# Patient Record
Sex: Male | Born: 1945
Health system: Southern US, Community
[De-identification: ages and names within clinical notes are randomized; demographics above are authoritative.]

## PROBLEM LIST (undated history)

## (undated) DIAGNOSIS — E785 Hyperlipidemia, unspecified: Secondary | ICD-10-CM

## (undated) DIAGNOSIS — I2510 Atherosclerotic heart disease of native coronary artery without angina pectoris: Secondary | ICD-10-CM

## (undated) DIAGNOSIS — N400 Enlarged prostate without lower urinary tract symptoms: Secondary | ICD-10-CM

## (undated) DIAGNOSIS — G540 Brachial plexus disorders: Secondary | ICD-10-CM

## (undated) DIAGNOSIS — M1990 Unspecified osteoarthritis, unspecified site: Secondary | ICD-10-CM

## (undated) DIAGNOSIS — Z86010 Personal history of colonic polyps: Secondary | ICD-10-CM

## (undated) DIAGNOSIS — I10 Essential (primary) hypertension: Secondary | ICD-10-CM

## (undated) HISTORY — DX: Essential (primary) hypertension: I10

## (undated) HISTORY — PX: COLONOSCOPY W/ POLYPECTOMY: SHX1380

## (undated) HISTORY — DX: Atherosclerotic heart disease of native coronary artery without angina pectoris: I25.10

## (undated) HISTORY — PX: NASAL SINUS SURGERY: SHX719

## (undated) HISTORY — DX: Hyperlipidemia, unspecified: E78.5

## (undated) HISTORY — PX: NM RENAL LASIX (ARMC HX): HXRAD1213

## (undated) HISTORY — DX: Benign prostatic hyperplasia without lower urinary tract symptoms: N40.0

## (undated) HISTORY — DX: Personal history of colonic polyps: Z86.010

## (undated) HISTORY — PX: HAND TENDON SURGERY: SHX663

---

## 2000-07-28 ENCOUNTER — Emergency Department (HOSPITAL_COMMUNITY): Admission: EM | Admit: 2000-07-28 | Discharge: 2000-07-28 | Payer: Self-pay | Admitting: Emergency Medicine

## 2002-02-17 ENCOUNTER — Ambulatory Visit (HOSPITAL_COMMUNITY): Admission: RE | Admit: 2002-02-17 | Discharge: 2002-02-17 | Payer: Self-pay | Admitting: *Deleted

## 2002-02-17 ENCOUNTER — Encounter (INDEPENDENT_AMBULATORY_CARE_PROVIDER_SITE_OTHER): Payer: Self-pay

## 2003-05-29 ENCOUNTER — Encounter: Admission: RE | Admit: 2003-05-29 | Discharge: 2003-05-29 | Payer: Self-pay | Admitting: Family Medicine

## 2003-07-13 ENCOUNTER — Encounter: Admission: RE | Admit: 2003-07-13 | Discharge: 2003-07-13 | Payer: Self-pay | Admitting: Otolaryngology

## 2009-04-19 ENCOUNTER — Encounter: Payer: Self-pay | Admitting: Internal Medicine

## 2009-05-07 ENCOUNTER — Ambulatory Visit: Payer: Self-pay | Admitting: Internal Medicine

## 2009-05-07 DIAGNOSIS — Z860100 Personal history of colon polyps, unspecified: Secondary | ICD-10-CM | POA: Insufficient documentation

## 2009-05-07 DIAGNOSIS — I10 Essential (primary) hypertension: Secondary | ICD-10-CM

## 2009-05-07 DIAGNOSIS — N400 Enlarged prostate without lower urinary tract symptoms: Secondary | ICD-10-CM | POA: Insufficient documentation

## 2009-05-07 DIAGNOSIS — E785 Hyperlipidemia, unspecified: Secondary | ICD-10-CM | POA: Insufficient documentation

## 2009-05-07 HISTORY — DX: Essential (primary) hypertension: I10

## 2009-05-07 HISTORY — DX: Benign prostatic hyperplasia without lower urinary tract symptoms: N40.0

## 2009-05-07 HISTORY — DX: Personal history of colonic polyps: Z86.010

## 2009-05-07 HISTORY — DX: Hyperlipidemia, unspecified: E78.5

## 2009-05-07 HISTORY — DX: Personal history of colon polyps, unspecified: Z86.0100

## 2009-09-25 ENCOUNTER — Encounter: Payer: Self-pay | Admitting: Internal Medicine

## 2010-01-01 ENCOUNTER — Ambulatory Visit: Payer: Self-pay | Admitting: Internal Medicine

## 2010-01-01 LAB — CONVERTED CEMR LAB
Alkaline Phosphatase: 54 units/L (ref 39–117)
BUN: 20 mg/dL (ref 6–23)
Basophils Absolute: 0.1 10*3/uL (ref 0.0–0.1)
Basophils Relative: 1.1 % (ref 0.0–3.0)
Bilirubin Urine: NEGATIVE
Bilirubin, Direct: 0.1 mg/dL (ref 0.0–0.3)
CO2: 30 meq/L (ref 19–32)
Calcium: 9.8 mg/dL (ref 8.4–10.5)
Cholesterol: 215 mg/dL — ABNORMAL HIGH (ref 0–200)
Creatinine, Ser: 1.1 mg/dL (ref 0.4–1.5)
Direct LDL: 121.9 mg/dL
Eosinophils Absolute: 0.3 10*3/uL (ref 0.0–0.7)
Glucose, Urine, Semiquant: NEGATIVE
Ketones, urine, test strip: NEGATIVE
Lymphocytes Relative: 23.2 % (ref 12.0–46.0)
MCHC: 34.7 g/dL (ref 30.0–36.0)
Monocytes Absolute: 0.6 10*3/uL (ref 0.1–1.0)
Neutrophils Relative %: 62.5 % (ref 43.0–77.0)
PSA: 5.88 ng/mL — ABNORMAL HIGH (ref 0.10–4.00)
Platelets: 257 10*3/uL (ref 150.0–400.0)
Protein, U semiquant: NEGATIVE
RBC: 4.98 M/uL (ref 4.22–5.81)
RDW: 13.5 % (ref 11.5–14.6)
Total Bilirubin: 0.8 mg/dL (ref 0.3–1.2)
Total CHOL/HDL Ratio: 3
Triglycerides: 71 mg/dL (ref 0.0–149.0)
Urobilinogen, UA: 0.2
VLDL: 14.2 mg/dL (ref 0.0–40.0)
pH: 7

## 2010-01-08 ENCOUNTER — Encounter: Payer: Self-pay | Admitting: Internal Medicine

## 2010-01-08 ENCOUNTER — Ambulatory Visit: Payer: Self-pay | Admitting: Internal Medicine

## 2010-03-27 ENCOUNTER — Encounter: Payer: Self-pay | Admitting: Internal Medicine

## 2010-04-09 NOTE — Letter (Signed)
Summary: [facility]  [facility]   Imported By: Maryln Gottron 10/01/2009 10:28:27  _____________________________________________________________________  External Attachment:    Type:   Image     Comment:   External Document

## 2010-04-09 NOTE — Assessment & Plan Note (Signed)
Summary: TO BE EST/NJR   Vital Signs:  Patient profile:   65 year old male Height:      65.5 inches Weight:      183 pounds BMI:     30.10 Temp:     98.4 degrees F oral BP sitting:   130 / 80  (left arm) Cuff size:   regular  Vitals Entered By: Duard Brady LPN (May 07, 2009 12:58 PM) CC: new to est - no immedicate concerns - may need rx's Is Patient Diabetic? No   CC:  new to est - no immedicate concerns - may need rx's.  History of Present Illness:   65 year old patient seen today to set this with our practice.  he has an approximate year history of treated hypertension.  He has mild dyslipidemia, and is followed by urology for BPH. and  elevated PSA.  He is status post prostate biopsy in September of last year.  Has a history of colonic polyps.  Preventive Screening-Counseling & Management  Alcohol-Tobacco     Smoking Status: never  Caffeine-Diet-Exercise     Does Patient Exercise: yes  Allergies (verified): No Known Drug Allergies  Past History:  Past Medical History: Colonic polyps, hx of Hyperlipidemia Hypertension Benign prostatic hypertrophy elevated PSA  Past Surgical History: prostate biopsy, September 2010 severed FPL tendon (L thumb) 2002 Sinus surgery 2004  Colonoscopy.  2008  Family History: Reviewed history and no changes required. father died age 88, history of hypertension, dyslipidemia, cerebral aneurysm mother died age 13 from a ruptured abdominal aortic aneurysm history of dyslipidemia and hypertension  Three sisters are well maternal grandmother pancreatic cancer  Social History: Reviewed history and no changes required. Occupation:  Psychologist, sport and exercise Married Never Smoked Regular exercise-yes Smoking Status:  never Does Patient Exercise:  yes  Review of Systems  The patient denies anorexia, fever, weight loss, weight gain, vision loss, decreased hearing, hoarseness, chest pain, syncope, dyspnea on exertion, peripheral  edema, prolonged cough, headaches, hemoptysis, abdominal pain, melena, hematochezia, severe indigestion/heartburn, hematuria, incontinence, genital sores, muscle weakness, suspicious skin lesions, transient blindness, difficulty walking, depression, unusual weight change, abnormal bleeding, enlarged lymph nodes, angioedema, breast masses, and testicular masses.    Physical Exam  General:  overweight-appearing.  120/80 Head:  Normocephalic and atraumatic without obvious abnormalities. No apparent alopecia or balding. Eyes:  No corneal or conjunctival inflammation noted. EOMI. Perrla. Funduscopic exam benign, without hemorrhages, exudates or papilledema. Vision grossly normal. Ears:  External ear exam shows no significant lesions or deformities.  Otoscopic examination reveals clear canals, tympanic membranes are intact bilaterally without bulging, retraction, inflammation or discharge. Hearing is grossly normal bilaterally. Mouth:  Oral mucosa and oropharynx without lesions or exudates.  Teeth in good repair. Neck:  No deformities, masses, or tenderness noted. Chest Wall:  No deformities, masses, tenderness or gynecomastia noted. Breasts:  No masses or gynecomastia noted Lungs:  Normal respiratory effort, chest expands symmetrically. Lungs are clear to auscultation, no crackles or wheezes. Heart:  Normal rate and regular rhythm. S1 and S2 normal without gallop, murmur, click, rub or other extra sounds. Abdomen:  Bowel sounds positive,abdomen soft and non-tender without masses, organomegaly or hernias noted. Genitalia:  Testes bilaterally descended without nodularity, tenderness or masses. No scrotal masses or lesions. No penis lesions or urethral discharge. Msk:  No deformity or scoliosis noted of thoracic or lumbar spine.   Pulses:  R and L carotid,radial,femoral,dorsalis pedis and posterior tibial pulses are full and equal bilaterally Extremities:  No clubbing, cyanosis, edema,  or deformity noted  with normal full range of motion of all joints.   Neurologic:  No cranial nerve deficits noted. Station and gait are normal. Plantar reflexes are down-going bilaterally. DTRs are symmetrical throughout. Sensory, motor and coordinative functions appear intact.   Impression & Recommendations:  Problem # 1:  Preventive Health Care (ICD-V70.0)  Complete Medication List: 1)  Hydrochlorothiazide 25 Mg Tabs (Hydrochlorothiazide) .... Qd 2)  Tamsulosin Hcl 0.4 Mg Caps (Tamsulosin hcl) .... Once daily 3)  Aspir-low 81 Mg Tbec (Aspirin) .... Qd 4)  Mens Multivitamin Plus Tabs (Multiple vitamins-minerals) .... Qd 5)  Calcium 600 1500 Mg Tabs (Calcium carbonate) .... 2 qd 6)  Vitamin D 1000 Unit Tabs (Cholecalciferol) .... 2qd 7)  Co Q-10 150 Mg Caps (Coenzyme q10) .... Qd 8)  Cinnamon 500 Mg Tabs (Cinnamon) .... 2qd  Patient Instructions: 1)  Stick with a low residue diet and avoid foods that can irritate your bowels. 2)  It is important that you exercise regularly at least 20 minutes 5 times a week. If you develop chest pain, have severe difficulty breathing, or feel very tired , stop exercising immediately and seek medical attention. 3)  You need to lose weight. Consider a lower calorie diet and regular exercise.  4)  Check your Blood Pressure regularly. If it is above: 150/90  you should make an appointment. Prescriptions: TAMSULOSIN HCL 0.4 MG CAPS (TAMSULOSIN HCL) once daily  #90 x 6   Entered and Authorized by:   Gordy Savers  MD   Signed by:   Gordy Savers  MD on 05/07/2009   Method used:   Print then Give to Patient   RxID:   [id number] HYDROCHLOROTHIAZIDE 25 MG TABS (HYDROCHLOROTHIAZIDE) qd  #90 x 6   Entered and Authorized by:   Gordy Savers  MD   Signed by:   Gordy Savers  MD on 05/07/2009   Method used:   Print then Give to Patient   RxID:   [id number]

## 2010-04-09 NOTE — Miscellaneous (Signed)
Summary: Waiver of Liability for Shingles Vaccine  Waiver of Liability for Shingles Vaccine   Imported By: Maryln Gottron 01/10/2010 09:17:02  _____________________________________________________________________  External Attachment:    Type:   Image     Comment:   External Document

## 2010-04-09 NOTE — Assessment & Plan Note (Signed)
Summary: CPX // RS   Vital Signs:  Patient profile:   65 year old male Height:      65.5 inches Weight:      189 pounds Temp:     97.9 degrees F oral BP sitting:   140 / 90  (left arm) Cuff size:   regular  Vitals Entered By: Kern Reap CMA Duncan Dull) (January 08, 2010 8:52 AM) CC: wellness exam Is Patient Diabetic? No Pain Assessment Patient in pain? no        CC:  wellness exam.  History of Present Illness: 65 year old patient who is seen today for a wellness exam.  He is followed closely by neurology due to an elevated PSA.  He has a history of hypertension, which has been stable.  For the past 6 to 9 months, he has had some slightly loose stool, but really no change in his health.  Allergies: No Known Drug Allergies  Past History:  Past Medical History: Reviewed history from 05/07/2009 and no changes required. Colonic polyps, hx of Hyperlipidemia Hypertension Benign prostatic hypertrophy elevated PSA  Past Surgical History: prostate biopsy, September 2010, march 2011 severed FPL tendon (L thumb) 2002 Sinus surgery 2004  Colonoscopy.  2008  Family History: Reviewed history from 05/07/2009 and no changes required. father died age 58, history of hypertension, dyslipidemia, cerebral aneurysm mother died age 53  from a ruptured abdominal aortic aneurysm history of dyslipidemia and hypertension  Three sisters are well maternal grandmother pancreatic cancer  Social History: Reviewed history from 05/07/2009 and no changes required. 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    Physical Exam  General:  overweight-appearing.  120/80overweight-appearing.   Head:  Normocephalic and atraumatic without obvious abnormalities. No apparent alopecia or balding. Eyes:  No corneal or conjunctival inflammation noted. EOMI. Perrla. Funduscopic exam benign, without hemorrhages, exudates or papilledema. Vision grossly normal. Ears:  External ear exam shows no significant lesions or deformities.  Otoscopic examination reveals clear canals, tympanic membranes are intact bilaterally without bulging, retraction, inflammation or discharge. Hearing is grossly normal bilaterally. Nose:  External nasal examination shows no deformity or inflammation. Nasal mucosa are pink and moist without lesions or exudates. Mouth:  Oral mucosa and oropharynx without lesions or exudates.  Teeth in good repair. Neck:  No deformities, masses, or tenderness noted. Chest Wall:  No deformities, masses, tenderness or gynecomastia noted. Breasts:  No masses or gynecomastia noted Lungs:  Normal respiratory effort, chest expands symmetrically. Lungs are clear to auscultation, no crackles or wheezes. Heart:  Normal rate and regular rhythm. S1 and S2 normal without gallop, murmur, click, rub or other extra sounds. Abdomen:  Bowel sounds positive,abdomen soft and non-tender without masses, organomegaly or hernias noted. Genitalia:  Testes bilaterally descended without nodularity, tenderness or masses. No scrotal masses or lesions. No penis lesions or urethral discharge. Msk:  No deformity or scoliosis noted of thoracic or lumbar spine.   Pulses:  slight decreased pedal pulses on the right Extremities:  No clubbing, cyanosis, edema, or deformity noted with normal full range of motion of all joints.   Neurologic:  No  cranial nerve deficits  noted. Station and gait are normal. Plantar reflexes are down-going bilaterally. DTRs are symmetrical throughout. Sensory, motor and coordinative functions appear intact. Skin:  Intact without suspicious lesions or rashes Cervical Nodes:  No lymphadenopathy noted Axillary Nodes:  No palpable lymphadenopathy Inguinal Nodes:  No significant adenopathy Psych:  Cognition and judgment appear intact. Alert and cooperative with normal attention span and concentration. No apparent delusions, illusions, hallucinations   Impression & Recommendations:  Problem # 1:  PREVENTIVE HEALTH CARE (ICD-V70.0)  Complete Medication List: 1)  Hydrochlorothiazide 25 Mg Tabs (Hydrochlorothiazide) .... Qd 2)  Tamsulosin Hcl 0.4 Mg Caps (Tamsulosin hcl) .... Once daily 3)  Aspir-low 81 Mg Tbec (Aspirin) .... Qd 4)  Mens Multivitamin Plus Tabs (Multiple vitamins-minerals) .... Qd 5)  Calcium 600 1500 Mg Tabs (Calcium carbonate) .... 2 qd 6)  Vitamin D 1000 Unit Tabs (Cholecalciferol) .... 2qd 7)  Co Q-10 150 Mg Caps (Coenzyme q10) .... Qd 8)  Cinnamon 500 Mg Tabs (Cinnamon) .... 2qd  Patient Instructions: 1)  Please schedule a follow-up appointment in 1 year. 2)  Limit your Sodium (Salt). 3)  It is important that you exercise regularly at least 20 minutes 5 times a week. If you develop chest pain, have severe difficulty breathing, or feel very tired , stop exercising immediately and seek medical attention. 4)  You need to lose weight. Consider a lower calorie diet and regular exercise.  5)  Check your Blood Pressure regularly. If it is above:  150/90 you should make an appointment. Prescriptions: TAMSULOSIN HCL 0.4 MG CAPS (TAMSULOSIN HCL) once daily  #90 x 6   Entered and Authorized by:   Gordy Savers  MD   Signed by:   Gordy Savers  MD on 01/08/2010   Method used:   Print then Give to Patient   RxID:   [id number] HYDROCHLOROTHIAZIDE 25 MG TABS (HYDROCHLOROTHIAZIDE) qd  #90 x 6   Entered  and Authorized by:   Gordy Savers  MD   Signed by:   Gordy Savers  MD on 01/08/2010   Method used:   Print then Give to Patient   RxID:   [id number]    Orders Added: 1)  Est. Patient 40-64 years [99396]     Appended Document: CPX // RS     Allergies: No Known Drug Allergies  Review of Systems       Flu Vaccine Consent Questions     Do you have a history of severe allergic reactions to this vaccine? no    Any prior history of allergic reactions to egg and/or gelatin? no    Do you have a sensitivity to the preservative Thimersol? no    Do you have a past history of Guillan-Barre Syndrome? no    Do you currently have an acute febrile illness? no    Have you ever had a severe reaction to latex? no    Vaccine information given and explained to patient? yes    Are you currently pregnant? no    Lot Number:[id number]   Exp Date:09/07/2010   Site Given  Right  Deltoid IM    Complete Medication List: 1)  Hydrochlorothiazide 25 Mg Tabs (Hydrochlorothiazide) .... Qd 2)  Tamsulosin Hcl 0.4 Mg Caps (Tamsulosin hcl) .... Once daily 3)  Aspir-low 81 Mg Tbec (Aspirin) .... Qd 4)  Mens Multivitamin Plus Tabs (Multiple vitamins-minerals) .... Qd 5)  Calcium 600 1500 Mg Tabs (Calcium carbonate) .... 2 qd 6)  Vitamin D  1000 Unit Tabs (Cholecalciferol) .... 2qd 7)  Co Q-10 150 Mg Caps (Coenzyme q10) .... Qd 8)  Cinnamon 500 Mg Tabs (Cinnamon) .... 2qd  Other Orders: EKG w/ Interpretation (93000) Admin 1st Vaccine (25053) Flu Vaccine 25yrs + (97673) Tdap => 44yrs IM (41937) Admin of Any Addtl Vaccine (90240) Zoster (Shingles) Vaccine Live (97353)   Orders Added: 1)  EKG w/ Interpretation [93000] 2)  Admin 1st Vaccine [90471] 3)  Flu Vaccine 65yrs + [29924] 4)  Tdap => 62yrs IM [90715] 5)  Admin of Any Addtl Vaccine [90472] 6)  Zoster (Shingles) Vaccine Live [26834]   Immunizations Administered:  Tetanus Vaccine:    Vaccine Type: Tdap    Site: right  deltoid    Mfr: GlaxoSmithKline    Dose: 0.5 ml    Route: IM    Given by: Kern Reap CMA (AAMA)    Exp. Date: 12/28/2011    Lot #: [id number]  Zostavax # 1:    Vaccine Type: Zostavax    Site: right deltoid    Mfr: Merck    Dose: 0.65    Route: [place]    Given by: Kern Reap CMA (AAMA)    Exp. Date: 10/26/2010    Lot #: [id number]    Physician counseled: yes   Immunizations Administered:  Tetanus Vaccine:    Vaccine Type: Tdap    Site: right deltoid    Mfr: GlaxoSmithKline    Dose: 0.5 ml    Route: IM    Given by: Kern Reap CMA (AAMA)    Exp. Date: 12/28/2011    Lot #: [id number]  Zostavax # 1:    Vaccine Type: Zostavax    Site: right deltoid    Mfr: Merck    Dose: 0.65    Route: [place]    Given by: Kern Reap CMA (AAMA)    Exp. Date: 10/26/2010    Lot #: [id number]    Physician counseled: yes

## 2010-04-09 NOTE — Letter (Signed)
Summary: Records from [facility] 2008 - 2011  Records from [facility] 2008 - 2011   Imported By: Maryln Gottron 05/02/2009 09:35:19  _____________________________________________________________________  External Attachment:    Type:   Image     Comment:   External Document

## 2010-04-11 NOTE — Letter (Signed)
Summary: [facility]  [facility]   Imported By: Maryln Gottron 04/04/2010 11:19:41  _____________________________________________________________________  External Attachment:    Type:   Image     Comment:   External Document

## 2010-06-17 ENCOUNTER — Other Ambulatory Visit: Payer: Self-pay | Admitting: Internal Medicine

## 2010-12-24 ENCOUNTER — Telehealth: Payer: Self-pay | Admitting: Internal Medicine

## 2010-12-24 MED ORDER — ZOLPIDEM TARTRATE 5 MG PO TABS: 5.0000 mg | ORAL_TABLET | Freq: Every evening | ORAL | Status: DC | PRN

## 2010-12-24 NOTE — Telephone Encounter (Signed)
Please advise 

## 2010-12-24 NOTE — Telephone Encounter (Signed)
Generic Ambien  5 mg #30 

## 2010-12-24 NOTE — Telephone Encounter (Signed)
Pt is having a lot of stress and is having a hard time sleeping. Pt is wanting to get a sleep aid, please contact pt.

## 2011-01-01 ENCOUNTER — Ambulatory Visit (INDEPENDENT_AMBULATORY_CARE_PROVIDER_SITE_OTHER): Payer: 59 | Admitting: Internal Medicine

## 2011-01-01 ENCOUNTER — Encounter: Payer: Self-pay | Admitting: Internal Medicine

## 2011-01-01 DIAGNOSIS — F411 Generalized anxiety disorder: Secondary | ICD-10-CM

## 2011-01-01 DIAGNOSIS — F419 Anxiety disorder, unspecified: Secondary | ICD-10-CM

## 2011-01-01 DIAGNOSIS — I10 Essential (primary) hypertension: Secondary | ICD-10-CM

## 2011-01-01 DIAGNOSIS — R079 Chest pain, unspecified: Secondary | ICD-10-CM

## 2011-01-01 DIAGNOSIS — Z0000 Encounter for general adult medical examination without abnormal findings: Secondary | ICD-10-CM

## 2011-01-01 DIAGNOSIS — Z23 Encounter for immunization: Secondary | ICD-10-CM

## 2011-01-01 MED ORDER — SERTRALINE HCL 50 MG PO TABS: 50.0000 mg | ORAL_TABLET | Freq: Every day | ORAL | Status: DC

## 2011-01-01 MED ORDER — LORAZEPAM 0.5 MG PO TABS: 0.5000 mg | ORAL_TABLET | Freq: Two times a day (BID) | ORAL | Status: DC

## 2011-01-01 NOTE — Patient Instructions (Signed)
It is important that you exercise regularly, at least 20 minutes 3 to 4 times per week.  If you develop chest pain or shortness of breath seek  medical attention.   

## 2011-01-01 NOTE — Progress Notes (Signed)
  Subjective:    Patient ID: Joel [name], male    DOB: 01-09-1946, 65 y.o.   MRN: [id number]  HPI  65 year old patient who is seen today for followup. He has had considerable situational stress due to the poor economy and his struggling business.  He is sleeping poorly and Ambien has been called in which has been quite helpful he describes significant anxiety stress and was somewhat concerned about his heart. He basically has some occasional twinges of mainly left arm discomfort but occasionally in the chest. He does have hypertension but no history of exertional chest pain.    Review of Systems  Constitutional: Negative for fever, chills, appetite change and fatigue.  HENT: Negative for hearing loss, ear pain, congestion, sore throat, trouble swallowing, neck stiffness, dental problem, voice change and tinnitus.   Eyes: Negative for pain, discharge and visual disturbance.  Respiratory: Negative for cough, chest tightness, wheezing and stridor.   Cardiovascular: Negative for chest pain, palpitations and leg swelling.  Gastrointestinal: Negative for nausea, vomiting, abdominal pain, diarrhea, constipation, blood in stool and abdominal distention.  Genitourinary: Negative for urgency, hematuria, flank pain, discharge, difficulty urinating and genital sores.  Musculoskeletal: Negative for myalgias, back pain, joint swelling, arthralgias and gait problem.  Skin: Negative for rash.  Neurological: Negative for dizziness, syncope, speech difficulty, weakness, numbness and headaches.  Hematological: Negative for adenopathy. Does not bruise/bleed easily.  Psychiatric/Behavioral: Positive for sleep disturbance and dysphoric mood. Negative for behavioral problems. The patient is nervous/anxious.        Objective:   Physical Exam  Constitutional: He appears well-developed and well-nourished. No distress.  Neck: Normal range of motion. Neck supple. No JVD present.  Cardiovascular: Normal rate,  regular rhythm, normal heart sounds and intact distal pulses.   No murmur heard. Pulmonary/Chest: Effort normal and breath sounds normal.  Abdominal: Soft. Bowel sounds are normal.  Psychiatric: He has a normal mood and affect.          Assessment & Plan:   Situational stress with insomnia. We'll continue Ambien when necessary. Will place on sertraline and also lorazepam when necessary at least for the short-term Hypertension stable Atypical chest pain. EKG normal

## 2011-01-12 ENCOUNTER — Other Ambulatory Visit: Payer: Self-pay | Admitting: Internal Medicine

## 2011-02-03 ENCOUNTER — Telehealth: Payer: Self-pay | Admitting: Internal Medicine

## 2011-02-03 NOTE — Telephone Encounter (Signed)
Spoke with pt- has transitioned to zoloft, on for 1 mos now, doing well - stopped lorazopam - has diarrhea - no other gi SX , feels ok , diet restrictions in place , immodium used for 2 days without relief.  Any suggestions ? Is this r/t starting on the zoloft?

## 2011-02-03 NOTE — Telephone Encounter (Signed)
Possible; stop zoloft for 5 days and then rechallege after diarrhea has resolved

## 2011-02-03 NOTE — Telephone Encounter (Signed)
Pt has questions on his sertraline (ZOLOFT) 50 MG tablet please contact

## 2011-02-04 NOTE — Telephone Encounter (Signed)
Spoke with pt - informed of dr. Vernon Prey instructions . KIK

## 2011-03-05 ENCOUNTER — Encounter: Payer: Self-pay | Admitting: Internal Medicine

## 2011-03-07 ENCOUNTER — Encounter: Payer: Self-pay | Admitting: Internal Medicine

## 2011-03-07 ENCOUNTER — Ambulatory Visit (INDEPENDENT_AMBULATORY_CARE_PROVIDER_SITE_OTHER): Payer: 59 | Admitting: Internal Medicine

## 2011-03-07 DIAGNOSIS — I10 Essential (primary) hypertension: Secondary | ICD-10-CM

## 2011-03-07 DIAGNOSIS — G4700 Insomnia, unspecified: Secondary | ICD-10-CM

## 2011-03-07 NOTE — Patient Instructions (Signed)
Limit your sodium (Salt) intake    It is important that you exercise regularly, at least 20 minutes 3 to 4 times per week.  If you develop chest pain or shortness of breath seek  medical attention.  Please check your blood pressure on a regular basis.  If it is consistently greater than 150/90, please make an office appointment.  Return in 6 months for follow-up   

## 2011-03-07 NOTE — Progress Notes (Signed)
  Subjective:    Patient ID: Joel [name], male    DOB: 06/21/1945, 65 y.o.   MRN: [id number]  HPI  65 year old patient who is seen today for followup. He has a history of treated hypertension. He was placed on sertraline 2 months ago due to situational stress insomnia and anxiety. This was quite helpful and he was able to wean himself from nightly hypnotic. However this medication caused diarrhea which resolved after discontinuation of the medicine. The diarrhea resumed after restarting half the daily dose. Presently he is off all medication and doing quite well. Stress and insomnia was related to work related stressors and presently he feels like he is done well off medication   Review of Systems  Constitutional: Negative for fever, chills, appetite change and fatigue.  HENT: Negative for hearing loss, ear pain, congestion, sore throat, trouble swallowing, neck stiffness, dental problem, voice change and tinnitus.   Eyes: Negative for pain, discharge and visual disturbance.  Respiratory: Negative for cough, chest tightness, wheezing and stridor.   Cardiovascular: Negative for chest pain, palpitations and leg swelling.  Gastrointestinal: Negative for nausea, vomiting, abdominal pain, diarrhea, constipation, blood in stool and abdominal distention.  Genitourinary: Negative for urgency, hematuria, flank pain, discharge, difficulty urinating and genital sores.  Musculoskeletal: Negative for myalgias, back pain, joint swelling, arthralgias and gait problem.  Skin: Negative for rash.  Neurological: Negative for dizziness, syncope, speech difficulty, weakness, numbness and headaches.  Hematological: Negative for adenopathy. Does not bruise/bleed easily.  Psychiatric/Behavioral: Negative for behavioral problems and dysphoric mood. The patient is not nervous/anxious.        Objective:   Physical Exam  Constitutional: He appears well-developed and well-nourished. No distress.       Mildly  overweight. Blood pressure low normal          Assessment & Plan:   Situational stress anxiety insomnia improved. We'll continue off medication if patient relapses we'll consider alternate drugs Hypertension stable we'll continue hydrochlorothiazide. Schedule CPX in 6 months

## 2011-08-22 ENCOUNTER — Other Ambulatory Visit: Payer: 59

## 2011-08-29 ENCOUNTER — Ambulatory Visit (INDEPENDENT_AMBULATORY_CARE_PROVIDER_SITE_OTHER): Payer: BC Managed Care – PPO | Admitting: Internal Medicine

## 2011-08-29 ENCOUNTER — Encounter: Payer: Self-pay | Admitting: Internal Medicine

## 2011-08-29 VITALS — BP 112/80 | HR 70 | Temp 97.8°F | Resp 18 | Ht 65.5 in | Wt 183.0 lb

## 2011-08-29 DIAGNOSIS — R972 Elevated prostate specific antigen [PSA]: Secondary | ICD-10-CM

## 2011-08-29 DIAGNOSIS — Z0000 Encounter for general adult medical examination without abnormal findings: Secondary | ICD-10-CM

## 2011-08-29 DIAGNOSIS — I10 Essential (primary) hypertension: Secondary | ICD-10-CM

## 2011-08-29 DIAGNOSIS — N400 Enlarged prostate without lower urinary tract symptoms: Secondary | ICD-10-CM

## 2011-08-29 DIAGNOSIS — E785 Hyperlipidemia, unspecified: Secondary | ICD-10-CM

## 2011-08-29 DIAGNOSIS — Z86010 Personal history of colonic polyps: Secondary | ICD-10-CM

## 2011-08-29 LAB — CBC WITH DIFFERENTIAL/PLATELET
Basophils Relative: 0.8 % (ref 0.0–3.0)
Eosinophils Relative: 6.5 % — ABNORMAL HIGH (ref 0.0–5.0)
HCT: 45.2 % (ref 39.0–52.0)
Lymphs Abs: 1.7 10*3/uL (ref 0.7–4.0)
MCV: 90.9 fl (ref 78.0–100.0)
Monocytes Absolute: 0.5 10*3/uL (ref 0.1–1.0)
Monocytes Relative: 8 % (ref 3.0–12.0)
Neutrophils Relative %: 60 % (ref 43.0–77.0)
Platelets: 217 10*3/uL (ref 150.0–400.0)
RBC: 4.97 Mil/uL (ref 4.22–5.81)
WBC: 6.7 10*3/uL (ref 4.5–10.5)

## 2011-08-29 LAB — COMPREHENSIVE METABOLIC PANEL
Alkaline Phosphatase: 56 U/L (ref 39–117)
BUN: 24 mg/dL — ABNORMAL HIGH (ref 6–23)
CO2: 30 mEq/L (ref 19–32)
GFR: 65.82 mL/min (ref >60.00)
Glucose, Bld: 98 mg/dL (ref 70–99)
Total Bilirubin: 0.9 mg/dL (ref 0.3–1.2)
Total Protein: 6.6 g/dL (ref 6.0–8.3)

## 2011-08-29 LAB — LDL CHOLESTEROL, DIRECT: Direct LDL: 130.6 mg/dL

## 2011-08-29 LAB — PSA: PSA: 6.94 ng/mL — ABNORMAL HIGH (ref 0.10–4.00)

## 2011-08-29 LAB — LIPID PANEL
Cholesterol: 209 mg/dL — ABNORMAL HIGH (ref 0–200)
HDL: 65.2 mg/dL (ref >39.00)
Triglycerides: 75 mg/dL (ref 0.0–149.0)

## 2011-08-29 LAB — TSH: TSH: 0.68 u[IU]/mL (ref 0.35–5.50)

## 2011-08-29 MED ORDER — TAMSULOSIN HCL 0.4 MG PO CAPS: 0.4000 mg | ORAL_CAPSULE | Freq: Every day | ORAL | Status: DC

## 2011-08-29 MED ORDER — HYDROCORTISONE 2.5 % RE CREA: TOPICAL_CREAM | Freq: Two times a day (BID) | RECTAL | Status: AC

## 2011-08-29 MED ORDER — HYDROCHLOROTHIAZIDE 25 MG PO TABS: 25.0000 mg | ORAL_TABLET | Freq: Every day | ORAL | Status: DC

## 2011-08-29 NOTE — Progress Notes (Signed)
Subjective:    Patient ID: Joel [name], male    DOB: Sep 20, 1945, 66 y.o.   MRN: [id number]  HPI 66 year old patient who is seen today for a preventive health examination. He is scheduled for followup urology in 2 weeks and is planning on a followup colonoscopy this summer. He has treated hypertension history colonic polyps BPH and elevated PSA. He is doing quite well. Is having some difficulties with some situational stress and insomnia have improved. Family history is changed in that a sister has had an unprovoked pulmonary embolism   Preventive Screening-Counseling & Management  Alcohol-Tobacco  Smoking Status: never  Caffeine-Diet-Exercise  Does Patient Exercise: yes   Allergies (verified):  No Known Drug Allergies  Past History:  Past Medical History:  Colonic polyps, hx of  Hyperlipidemia  Hypertension  Benign prostatic hypertrophy  elevated PSA   Past Surgical History:  prostate biopsy, September 2010  severed FPL tendon (L thumb) 2002  Sinus surgery 2004  Colonoscopy. 2008   Family History:  Reviewed history and no changes required.  father died age 31, history of hypertension, dyslipidemia, cerebral aneurysm  mother died age 55 from a ruptured abdominal aortic aneurysm history of dyslipidemia and hypertension  Three sisters are well ; one with history of unprovoked pulmonary embolism maternal grandmother pancreatic cancer   Social History:  Reviewed history and no changes required.  Occupation: Psychologist, sport and exercise  Married  Never Smoked  Regular exercise-yes  Smoking Status: never  Does Patient Exercise: yes   Review of Systems  Constitutional: Negative for fever, chills, activity change, appetite change and fatigue.  HENT: Negative for hearing loss, ear pain, congestion, rhinorrhea, sneezing, mouth sores, trouble swallowing, neck pain, neck stiffness, dental problem, voice change, sinus pressure and tinnitus.   Eyes: Negative for photophobia, pain, redness  and visual disturbance.  Respiratory: Negative for apnea, cough, choking, chest tightness, shortness of breath and wheezing.   Cardiovascular: Negative for chest pain, palpitations and leg swelling.  Gastrointestinal: Positive for diarrhea (intermittent). Negative for nausea, vomiting, abdominal pain, constipation, blood in stool, abdominal distention, anal bleeding ( anal itching) and rectal pain.  Genitourinary: Negative for dysuria, urgency, frequency, hematuria, flank pain, decreased urine volume, discharge, penile swelling, scrotal swelling, difficulty urinating, genital sores and testicular pain.  Musculoskeletal: Negative for myalgias, back pain, joint swelling, arthralgias and gait problem.  Skin: Negative for color change, rash and wound.  Neurological: Negative for dizziness, tremors, seizures, syncope, facial asymmetry, speech difficulty, weakness, light-headedness, numbness and headaches.  Hematological: Negative for adenopathy. Does not bruise/bleed easily.  Psychiatric/Behavioral: Negative for suicidal ideas, hallucinations, behavioral problems, confusion, disturbed wake/sleep cycle, self-injury, dysphoric mood, decreased concentration and agitation. The patient is not nervous/anxious.        Objective:   Physical Exam  Constitutional: He appears well-developed and well-nourished.       Weight 183  HENT:  Head: Normocephalic and atraumatic.  Right Ear: External ear normal.  Left Ear: External ear normal.  Nose: Nose normal.  Mouth/Throat: Oropharynx is clear and moist.  Eyes: Conjunctivae and EOM are normal. Pupils are equal, round, and reactive to light. No scleral icterus.  Neck: Normal range of motion. Neck supple. No JVD present. No thyromegaly present.  Cardiovascular: Regular rhythm, normal heart sounds and intact distal pulses.  Exam reveals no gallop and no friction rub.   No murmur heard. Pulmonary/Chest: Effort normal and breath sounds normal. He exhibits no  tenderness.  Abdominal: Soft. Bowel sounds are normal. He exhibits no distension and no  mass. There is no tenderness.  Genitourinary: Prostate normal and penis normal.  Musculoskeletal: Normal range of motion. He exhibits no edema and no tenderness.  Lymphadenopathy:    He has no cervical adenopathy.  Neurological: He is alert. He has normal reflexes. No cranial nerve deficit. Coordination normal.  Skin: Skin is warm and dry. No rash noted.  Psychiatric: He has a normal mood and affect. His behavior is normal.          Assessment & Plan:  Preventive health exam Hypertension well controlled BPH with elevated PSA. Followup neurology Colonic Polyps. Followup colonoscopy this summer

## 2011-08-29 NOTE — Patient Instructions (Signed)
Limit your sodium (Salt) intake    It is important that you exercise regularly, at least 20 minutes 3 to 4 times per week.  If you develop chest pain or shortness of breath seek  medical attention.  Please check your blood pressure on a regular basis.  If it is consistently greater than 150/90, please make an office appointment.  Schedule your colonoscopy to help detect colon cancer.  Followup urology  Return in one year for follow-up

## 2011-09-01 NOTE — Progress Notes (Signed)
Quick Note:  Mailed to home address ______ 

## 2012-01-05 ENCOUNTER — Other Ambulatory Visit: Payer: Self-pay | Admitting: Internal Medicine

## 2012-01-26 ENCOUNTER — Other Ambulatory Visit: Payer: Self-pay | Admitting: Internal Medicine

## 2012-01-27 NOTE — Telephone Encounter (Signed)
Med called in

## 2012-01-27 NOTE — Telephone Encounter (Signed)
ok 

## 2012-01-27 NOTE — Telephone Encounter (Signed)
Hydrodiuril refill request. Last filled on 6/21 qty 90 with 6 refills. Pt last seen on 6/21. Ok to refill?

## 2012-04-27 ENCOUNTER — Telehealth: Payer: Self-pay | Admitting: Internal Medicine

## 2012-04-27 NOTE — Telephone Encounter (Signed)
Patient Information:  Caller Name: Joel [name]  Phone: [phone]  Patient: Joel [name], Joel [name]  Gender: Male  DOB: Mar 27, 1945  Age: 67 Years  PCP: [facility])  Office Follow Up:  Does the office need to follow up with this patient?: Yes  Instructions For The Office: No appointments showing for PCP on 2/18/or 2/19. See within 3 days due to Diarrhea estimated 5 weeks. Please schedule appropriately and inform patient of time.  Thank you.   Symptoms  Reason For Call & Symptoms: Pt. states, "I have had prolonged diarrhea."  Is there something I can take or do I need to see Dr. Kirtland Bouchard."  Estimates 3 loose or watery stools per day.  Imodium gives temporary relief, but symptoms return.  Relates he has been out of the country on a cruise ship within the past month; symptoms had started prior to that.  Reviewed Health History In EMR: Yes  Reviewed Medications In EMR: Yes  Reviewed Allergies In EMR: Yes  Reviewed Surgeries / Procedures: Yes  Date of Onset of Symptoms: 03/23/2012  Treatments Tried: Imodium  Treatments Tried Worked: Yes  Guideline(s) Used:  Diarrhea  Disposition Per Guideline:   See Within 3 Days in Office  Reason For Disposition Reached:   Diarrhea persists > 7 days  Advice Given:  Fluids:  Drink more fluids, at least 8-10 glasses (8 oz or 240 ml) daily.  For example: sports drinks, diluted fruit juices, soft drinks.  Supplement this with saltine crackers or soups to make certain that you are getting sufficient fluid and salt to meet your body's needs.  Avoid caffeinated beverages (Reason: caffeine is mildly dehydrating).  Nutrition:  Ideal initial foods include boiled starches/cereals (e.g., potatoes, rice, noodles, wheat, oats) with a small amount of salt to taste.  Other acceptable foods include: bananas, yogurt, crackers, soup.

## 2012-04-27 NOTE — Telephone Encounter (Signed)
Appt made. Pt aware. Encounter closed.

## 2012-04-29 ENCOUNTER — Encounter: Payer: Self-pay | Admitting: Internal Medicine

## 2012-04-29 ENCOUNTER — Ambulatory Visit (INDEPENDENT_AMBULATORY_CARE_PROVIDER_SITE_OTHER): Payer: Medicare Other | Admitting: Internal Medicine

## 2012-04-29 VITALS — BP 156/90 | HR 67 | Temp 98.0°F | Resp 18 | Wt 191.0 lb

## 2012-04-29 DIAGNOSIS — R197 Diarrhea, unspecified: Secondary | ICD-10-CM

## 2012-04-29 DIAGNOSIS — I10 Essential (primary) hypertension: Secondary | ICD-10-CM

## 2012-04-29 MED ORDER — ALIGN PO CAPS: 1.0000 | ORAL_CAPSULE | Freq: Every day | ORAL | Status: DC

## 2012-04-29 NOTE — Patient Instructions (Addendum)
Chronic Diarrhea Diarrhea is loose, watery stools. Having diarrhea means passing loose stools 3 or more times a day. Diarrhea that lasts longer than 4 weeks is considered long-lasting (chronic). Symptoms of chronic diarrhea may be continual or may come and go. People of all ages can get diarrhea. Body fluid loss (dehydration) may occur as a result of diarrhea. This means the body does not have as many fluids and salts (electrolytes) as it needs. CAUSES  There are many causes of chronic diarrhea. Causes may be different for children and adults. The various causes can be grouped into 2 categories: diarrhea caused by an infection and diarrhea not caused by an infection. Sometimes, the cause is unknown. Diarrhea caused by an infection may result from:  Parasites.  Bacteria.  Viral infections. Diarrhea not caused by an infection may result from:  Irritable bowel syndrome.  Reaction to medicines, such as antibiotics, cancer drugs, blood pressure medicines, and antacids.  Intestinal disease (Crohn's disease, ulcerative colitis, celiac disease).  Food allergies or sensitivity to additives (fructose, lactose, sugar substitutes).  Tumors.  Diabetes, thyroid disease, and other endocrine diseases.  Reduced blood flow to the intestine.  Previous surgery or radiation of the abdomen or gastrointestinal tract. Risk factors for chronic diarrhea include:  Having a severely weakened immune system, such as from HIV/AIDS.  Taking certain types of cancer-fighting drugs (chemotherapy) or other medicines.  A recent organ transplant.  Having a portion of the stomach removed.  Traveling to countries where food and water supplies are often contaminated. SYMPTOMS  In addition to frequent, loose stools, diarrhea may cause:  Cramping.  Abdominal pain.  Nausea.  Urgent need to use the bathroom, or loss of bowel control. If dehydration occurs, problems include:  Thirst.  Less frequent  urination.  Dark urine.  Dry skin.  Fatigue.  Dizziness. Infections that cause diarrhea may also cause a fever, chills, or bloody stools. DIAGNOSIS  Diagnosis may be difficult. Your caregiver must take a careful history and perform a physical exam. Tests given are based on your symptoms and history. Tests may include:  Blood or stool tests, in which 3 or more stool samples may be examined. Stool cultures may be used to test for bacteria or parasites.  X-rays.  A procedure in which a thin tube is inserted into the mouth or rectum (endoscopy). This allows the caregiver to look inside the intestine. TREATMENT   Diarrhea caused by an infection can often be treated with antibiotics.  Diarrhea not caused by an infection is more difficult to diagnose and treat. Long-term medicine use or surgery may be required. Specific treatment should be discussed with your caregiver.  If the cause cannot determined, treatment to relieve symptoms includes:  Preventing dehydration. Serious health problems can occur if you do not maintain proper fluid levels. Many oral rehydration solutions (ORS) are available at drug stores. Ask your caregiver what product is best for you.  Not drinking beverages that contain caffeine (tea, coffee, soft drinks).  Not drinking alcohol. It causes dehydration.  Not relying on sports drinks and broths alone to maintain proper fluid levels. They should not be used to prevent severe dehydration.  Maintaining well-balanced nutrition. This may help you recover faster. PREVENTION   Drink clean or purified water.  Use proper food handling techniques.  Maintain proper hand-washing habits. HOME CARE INSTRUCTIONS   Avoid:  Caffeine.  Greasy foods.  High fiber.  If you have problems digesting lactose during or after an episode of diarrhea, you might  want to try yogurt. Yogurt is often better tolerated, because it has less lactose than milk. Yogurt with active, live  bacterial cultures may even help you recover faster. SEEK MEDICAL CARE IF:  The person with diarrhea is an otherwise healthy adult and has:  Signs of dehydration.  Diarrhea for more than 2 days.  Severe pain in the abdomen or rectum.  An oral temperature above 102 F (38.9 C).  Stools containing blood or pus.  Stools that are black and tarry. SEEK IMMEDIATE MEDICAL CARE IF:  The person with diarrhea is a child, elderly person, or has a weakened immune system and has:  Signs of dehydration.  Diarrhea for more than 1 day.  Severe pain in the abdomen or rectum.  An oral temperature above 102 F (38.9 C), not controlled by medicine.  Stools containing blood or pus.  Stools that are black and tarry. Document Released: 05/17/2003 Document Revised: 05/19/2011 Document Reviewed: 07/13/2009 [facility] Patient Information 2013 [place], [place].   Call or return to clinic prn if these symptoms worsen or fail to improve as anticipated.

## 2012-04-29 NOTE — Progress Notes (Signed)
Subjective:    Patient ID: Joel [name], male    DOB: March 29, 1945, 67 y.o.   MRN: [id number]  HPI  67 year old patient who has treated hypertension. He is seen today with a chief complaint of diarrhea for about 6 weeks. He describes 2 bowel movements daily which range from soft to watery. No constitutional complaints or abdominal pain. There's been no weight loss. No nausea or vomiting. No significant change in his diet although attempting to cut back on calories. He has treated hypertension and states blood pressure readings at home have been in the 130/80 range He has history colonic polyps and did have a colonoscopy in the fall of last year  Past Medical History  Diagnosis Date  . BENIGN PROSTATIC HYPERTROPHY 05/07/2009  . COLONIC POLYPS, HX OF 05/07/2009  . HYPERLIPIDEMIA 05/07/2009  . HYPERTENSION 05/07/2009    History   Social History  . Marital Status: Married    Spouse Name: N/A    Number of Children: N/A  . Years of Education: N/A   Occupational History  . Not on file.   Social History Main Topics  . Smoking status: Never Smoker   . Smokeless tobacco: Never Used  . Alcohol Use: Yes  . Drug Use: No  . Sexually Active: Not on file   Other Topics Concern  . Not on file   Social History Narrative  . No narrative on file    Past Surgical History  Procedure Laterality Date  . Nasal sinus surgery    . Hand tendon surgery      Left thumb    Family History  Problem Relation Age of Onset  . Hyperlipidemia Mother   . Hypertension Mother   . Stroke Mother   . Hyperlipidemia Father   . Hypertension Father   . Stroke Father     No Known Allergies  Current Outpatient Prescriptions on File Prior to Visit  Medication Sig Dispense Refill  . aspirin 81 MG tablet Take 81 mg by mouth daily.        . hydrochlorothiazide (HYDRODIURIL) 25 MG tablet TAKE 1 TABLET BY MOUTH EVERY DAY  90 tablet  3  . Tamsulosin HCl (FLOMAX) 0.4 MG CAPS Take 1 capsule (0.4 mg total) by  mouth daily.  90 capsule  6   No current facility-administered medications on file prior to visit.    BP 156/90  Pulse 67  Temp(Src) 98 F (36.7 C) (Oral)  Resp 18  Wt 191 lb (86.637 kg)  BMI 31.29 kg/m2  SpO2 97%      Review of Systems  Constitutional: Negative for fever, chills, appetite change and fatigue.  HENT: Negative for hearing loss, ear pain, congestion, sore throat, trouble swallowing, neck stiffness, dental problem, voice change and tinnitus.   Eyes: Negative for pain, discharge and visual disturbance.  Respiratory: Negative for cough, chest tightness, wheezing and stridor.   Cardiovascular: Negative for chest pain, palpitations and leg swelling.  Gastrointestinal: Positive for diarrhea. Negative for nausea, vomiting, abdominal pain, constipation, blood in stool and abdominal distention.  Genitourinary: Negative for urgency, hematuria, flank pain, discharge, difficulty urinating and genital sores.  Musculoskeletal: Negative for myalgias, back pain, joint swelling, arthralgias and gait problem.  Skin: Negative for rash.  Neurological: Negative for dizziness, syncope, speech difficulty, weakness, numbness and headaches.  Hematological: Negative for adenopathy. Does not bruise/bleed easily.  Psychiatric/Behavioral: Negative for behavioral problems and dysphoric mood. The patient is not nervous/anxious.        Objective:  Physical Exam  Constitutional: He is oriented to person, place, and time. He appears well-developed and well-nourished. No distress.  Mildly overweight No distress Blood pressure 140/85  HENT:  Head: Normocephalic.  Right Ear: External ear normal.  Left Ear: External ear normal.  Eyes: Conjunctivae and EOM are normal.  Neck: Normal range of motion.  Cardiovascular: Normal rate and normal heart sounds.   Pulmonary/Chest: Breath sounds normal.  Abdominal: Bowel sounds are normal.  Musculoskeletal: Normal range of motion. He exhibits no edema  and no tenderness.  Neurological: He is alert and oriented to person, place, and time.  Psychiatric: He has a normal mood and affect. His behavior is normal.          Assessment & Plan:   Diarrhea-  Trial Align  HTN  Cont present Rx  CPX 6 moths

## 2012-06-17 ENCOUNTER — Telehealth: Payer: Self-pay | Admitting: Internal Medicine

## 2012-06-17 NOTE — Telephone Encounter (Signed)
Patient Information:  Caller Name: Taft  Phone: [phone]  Patient: Joel [name], Joel [name]  Gender: Male  DOB: Aug 30, 1945  Age: 67 Years  PCP: Eleon[facility]iqu[facility])  Office Follow Up:  Does the office need to follow up with this patient?: No  Instructions For The Office: N/A  RN Note:  Seen at hospital in [facility] and told it might be gout flare up.  States he was told he may need to have some bloodwork to confirm.  Xray showed no break, but did show arthritic changes and prescribed indomethacin, which helped, with ice and elevation over past several days.  Foot is currently not swollen or red.  Per foot pain protocol, emergent symptoms denied; advised appt within 72 hours.  Appt scheduled 0800 06/18/12 with Dr. Selena Batten.  krs/can  Symptoms  Reason For Call & Symptoms: flare up of gout; left great toe was inflamed  Reviewed Health History In EMR: Yes  Reviewed Medications In EMR: Yes  Reviewed Allergies In EMR: Yes  Reviewed Surgeries / Procedures: Yes  Date of Onset of Symptoms: 06/11/2012  Guideline(s) Used:  Foot Pain  Disposition Per Guideline:   See Within 3 Days in Office  Reason For Disposition Reached:   Pain in the big toe joint  Advice Given:  N/A  Patient Will Follow Care Advice:  YES  Appointment Scheduled:  06/18/2012 08:00:00 Appointment Scheduled Provider:  Kriste Basque (Family Practice)

## 2012-06-18 ENCOUNTER — Ambulatory Visit (INDEPENDENT_AMBULATORY_CARE_PROVIDER_SITE_OTHER): Payer: Medicare Other | Admitting: Family Medicine

## 2012-06-18 ENCOUNTER — Encounter: Payer: Self-pay | Admitting: Family Medicine

## 2012-06-18 VITALS — BP 124/86 | Temp 97.7°F | Wt 192.0 lb

## 2012-06-18 DIAGNOSIS — M109 Gout, unspecified: Secondary | ICD-10-CM

## 2012-06-18 DIAGNOSIS — I10 Essential (primary) hypertension: Secondary | ICD-10-CM

## 2012-06-18 MED ORDER — AMLODIPINE BESYLATE 5 MG PO TABS: 5.0000 mg | ORAL_TABLET | Freq: Every day | ORAL | Status: DC

## 2012-06-18 NOTE — Progress Notes (Signed)
Chief Complaint  Patient presents with  . pain in great toe on left foot    [place]; x-rays done;     HPI:  Acute visit for L great toe pain: -last Friday had L great toe pain acutely -was severe pain and toe was swollen -he went to ER in [place] -told was likely gout, but if was gout needed blood work -xray showed degenerative changes, given NSAIDs and is resolved now -he is worried about the hctz contributing to this and wants to stop this ROS: See pertinent positives and negatives per HPI.  Past Medical History  Diagnosis Date  . BENIGN PROSTATIC HYPERTROPHY 05/07/2009  . COLONIC POLYPS, HX OF 05/07/2009  . HYPERLIPIDEMIA 05/07/2009  . HYPERTENSION 05/07/2009    Family History  Problem Relation Age of Onset  . Hyperlipidemia Mother   . Hypertension Mother   . Stroke Mother   . Hyperlipidemia Father   . Hypertension Father   . Stroke Father     History   Social History  . Marital Status: Married    Spouse Name: N/A    Number of Children: N/A  . Years of Education: N/A   Social History Main Topics  . Smoking status: Never Smoker   . Smokeless tobacco: Never Used  . Alcohol Use: Yes  . Drug Use: No  . Sexually Active: None   Other Topics Concern  . None   Social History Narrative  . None    Current outpatient prescriptions:aspirin 81 MG tablet, Take 81 mg by mouth daily.  , Disp: , Rfl: ;  Tamsulosin HCl (FLOMAX) 0.4 MG CAPS, Take 1 capsule (0.4 mg total) by mouth daily., Disp: 90 capsule, Rfl: 6;  amLODipine (NORVASC) 5 MG tablet, Take 1 tablet (5 mg total) by mouth daily., Disp: 30 tablet, Rfl: 3;  bifidobacterium infantis (ALIGN) capsule, Take 1 capsule by mouth daily., Disp: 14 capsule, Rfl: 2  EXAM:  Filed Vitals:   06/18/12 0803  BP: 124/86  Temp: 97.7 F (36.5 C)    Body mass index is 31.45 kg/(m^2).  GENERAL: vitals reviewed and listed above, alert, oriented, appears well hydrated and in no acute  distress  HEENT: atraumatic, conjunttiva clear, no obvious abnormalities on inspection of external nose and ears  NECK: no obvious masses on inspection  LUNGS: clear to auscultation bilaterally, no wheezes, rales or rhonchi, good air movement  CV: HRRR, no peripheral edema  MS: moves all extremities without noticeable abnormality - feet look normal today, he points to first MTP on L as are that was swollen  PSYCH: pleasant and cooperative, no obvious depression or anxiety  ASSESSMENT AND PLAN:  Discussed the following assessment and plan:  HYPERTENSION - Plan: amLODipine (NORVASC) 5 MG tablet  Gout  -discussed likely gout an implications, treatment, prognosis - discussed uric acid is more useful check at least 2 weeks after attack -discussed other options for BP since he wants to stop hctz and risks, decided on norvasc - rx provided, risks discussed, he will follow up with PCP in 1 month to check no BP -Patient advised to return or notify a doctor immediately if symptoms worsen or persist or new concerns arise.  There are no Patient Instructions on file for this visit.   Terressa Koyanagi  -

## 2012-07-21 ENCOUNTER — Ambulatory Visit: Payer: Medicare Other | Admitting: Internal Medicine

## 2012-09-17 ENCOUNTER — Other Ambulatory Visit (INDEPENDENT_AMBULATORY_CARE_PROVIDER_SITE_OTHER): Payer: Medicare Other

## 2012-09-17 DIAGNOSIS — Z0000 Encounter for general adult medical examination without abnormal findings: Secondary | ICD-10-CM

## 2012-09-17 DIAGNOSIS — I10 Essential (primary) hypertension: Secondary | ICD-10-CM

## 2012-09-17 LAB — CBC WITH DIFFERENTIAL/PLATELET
Basophils Relative: 1.2 % (ref 0.0–3.0)
Eosinophils Absolute: 0.8 10*3/uL — ABNORMAL HIGH (ref 0.0–0.7)
Eosinophils Relative: 10.9 % — ABNORMAL HIGH (ref 0.0–5.0)
HCT: 45.5 % (ref 39.0–52.0)
Lymphs Abs: 1.7 10*3/uL (ref 0.7–4.0)
MCHC: 33.9 g/dL (ref 30.0–36.0)
MCV: 92.1 fl (ref 78.0–100.0)
Monocytes Absolute: 0.5 10*3/uL (ref 0.1–1.0)
Neutro Abs: 4.1 10*3/uL (ref 1.4–7.7)
RBC: 4.94 Mil/uL (ref 4.22–5.81)
WBC: 7.2 10*3/uL (ref 4.5–10.5)

## 2012-09-17 LAB — POCT URINALYSIS DIPSTICK
Bilirubin, UA: NEGATIVE
Blood, UA: NEGATIVE
Nitrite, UA: NEGATIVE
Protein, UA: NEGATIVE
pH, UA: 6.5

## 2012-09-17 LAB — HEPATIC FUNCTION PANEL
ALT: 33 U/L (ref 0–53)
Albumin: 4.3 g/dL (ref 3.5–5.2)
Bilirubin, Direct: 0.1 mg/dL (ref 0.0–0.3)
Total Protein: 6.9 g/dL (ref 6.0–8.3)

## 2012-09-17 LAB — TSH: TSH: 1.29 u[IU]/mL (ref 0.35–5.50)

## 2012-09-17 LAB — BASIC METABOLIC PANEL
CO2: 28 mEq/L (ref 19–32)
Chloride: 95 mEq/L — ABNORMAL LOW (ref 96–112)
Potassium: 4.9 mEq/L (ref 3.5–5.1)

## 2012-09-17 LAB — LDL CHOLESTEROL, DIRECT: Direct LDL: 137.6 mg/dL

## 2012-09-17 LAB — LIPID PANEL: HDL: 59.8 mg/dL (ref >39.00)

## 2012-09-17 LAB — PSA: PSA: 5.93 ng/mL — ABNORMAL HIGH (ref 0.10–4.00)

## 2012-09-24 ENCOUNTER — Ambulatory Visit (INDEPENDENT_AMBULATORY_CARE_PROVIDER_SITE_OTHER): Payer: Medicare Other | Admitting: Internal Medicine

## 2012-09-24 ENCOUNTER — Encounter: Payer: Self-pay | Admitting: Internal Medicine

## 2012-09-24 VITALS — BP 124/80 | Temp 98.3°F | Ht 65.5 in | Wt 189.0 lb

## 2012-09-24 DIAGNOSIS — N400 Enlarged prostate without lower urinary tract symptoms: Secondary | ICD-10-CM

## 2012-09-24 DIAGNOSIS — E785 Hyperlipidemia, unspecified: Secondary | ICD-10-CM

## 2012-09-24 DIAGNOSIS — Z860100 Personal history of colon polyps, unspecified: Secondary | ICD-10-CM

## 2012-09-24 DIAGNOSIS — I10 Essential (primary) hypertension: Secondary | ICD-10-CM

## 2012-09-24 DIAGNOSIS — Z0000 Encounter for general adult medical examination without abnormal findings: Secondary | ICD-10-CM

## 2012-09-24 DIAGNOSIS — Z23 Encounter for immunization: Secondary | ICD-10-CM

## 2012-09-24 MED ORDER — AMLODIPINE BESYLATE 5 MG PO TABS: 5.0000 mg | ORAL_TABLET | Freq: Every day | ORAL | Status: DC

## 2012-09-24 MED ORDER — TAMSULOSIN HCL 0.4 MG PO CAPS: 0.4000 mg | ORAL_CAPSULE | Freq: Every day | ORAL | Status: DC

## 2012-09-24 NOTE — Patient Instructions (Signed)
Limit your sodium (Salt) intake    It is important that you exercise regularly, at least 20 minutes 3 to 4 times per week.  If you develop chest pain or shortness of breath seek  medical attention.  You need to lose weight.  Consider a lower calorie diet and regular exercise.  Return in one year for follow-up 

## 2012-09-24 NOTE — Progress Notes (Signed)
Patient ID: Joel [name], male   DOB: [birth year], 67 y.o.   MRN: [id number]  Subjective:    Patient ID: Joel [name], male    DOB: January 02, 1946, 67 y.o.   MRN: [id number]  HPI 67 -year-old patient who is seen today for a preventive health examination.  He is followed by urology and did have a followup colonoscopy in in October of 2013 He has treated hypertension history colonic polyps BPH and elevated PSA. He is doing quite well.  Family history is changed in that a sister has had an unprovoked pulmonary embolism   Preventive Screening-Counseling & Management  Alcohol-Tobacco  Smoking Status: never  Caffeine-Diet-Exercise  Does Patient Exercise: yes   Allergies (verified):  No Known Drug Allergies  Past History:  Past Medical History:  Colonic polyps, hx of  Hyperlipidemia  Hypertension  Benign prostatic hypertrophy  elevated PSA   Past Surgical History:  prostate biopsy, September 2010  severed F[employer] tendon (L thumb) 2002  Sinus surgery 2004  Colonoscopy. 2008  2013   Family History:  Reviewed history and no changes required.  father died age 21, history of hypertension, dyslipidemia, cerebral aneurysm  mother died age 70 from a ruptured abdominal aortic aneurysm history of dyslipidemia and hypertension  Three sisters are well ; one with history of unprovoked pulmonary embolism maternal grandmother pancreatic cancer   Social History:  Reviewed history and no changes required.  Occupation: Psychologist, sport and exercise  Married  Never Smoked  Regular exercise-yes  Smoking Status: never  Does Patient Exercise: yes  1. Risk factors, based on past  M,S,F history- cardiovascular risk factors include hypertension only  2.  Physical activities: Fairly active without restrictions does walk one or 2 miles 3 times weekly. Bikes occasionally  3.  Depression/mood: History of anxiety disorder which has resolved;  has been on sertraline in the past  4.  Hearing: No deficits  5.  ADL's:  Independent  6.  Fall risk: Low  7.  Home safety: No problems identified  8.  Height weight, and visual acuity; height and weight stable no change in visual acuity  9.  Counseling: [name] healthy diet regular exercise modest weight loss all encouraged  10. Lab orders based on risk factors: Laboratory profile reviewed  11. Referral : Followup urology  12. Care plan: Continue present regimen and home blood pressure monitoring encouraged  13. Cognitive assessment: Alert and oriented with normal affect. No cognitive dysfunction  Past Medical History  Diagnosis Date  . BENIGN PROSTATIC HYPERTROPHY 05/07/2009  . COLONIC POLYPS, HX OF 05/07/2009  . HYPERLIPIDEMIA 05/07/2009  . HYPERTENSION 05/07/2009    History   Social History  . Marital Status: Married    Spouse Name: N/A    Number of Children: N/A  . Years of Education: N/A   Occupational History  . Not on file.   Social History Main Topics  . Smoking status: Never Smoker   . Smokeless tobacco: Never Used  . Alcohol Use: Yes  . Drug Use: No  . Sexually Active: Not on file   Other Topics Concern  . Not on file   Social History Narrative  . No narrative on file    Past Surgical History  Procedure Laterality Date  . Nasal sinus surgery    . Hand tendon surgery      Left thumb    Family History  Problem Relation Age of Onset  . Hyperlipidemia Mother   . Hypertension Mother   . Stroke Mother   .  Hyperlipidemia Father   . Hypertension Father   . Stroke Father     No Known Allergies  Current Outpatient Prescriptions on File Prior to Visit  Medication Sig Dispense Refill  . aspirin 81 MG tablet Take 81 mg by mouth daily.         No current facility-administered medications on file prior to visit.    BP 124/80  Temp(Src) 98.3 F (36.8 C) (Oral)  Ht 5' 5.5" (1.664 m)  Wt 189 lb (85.73 kg)  BMI 30.96 kg/m2         Review of Systems  Constitutional: Negative for fever, chills, activity change,  appetite change and fatigue.  HENT: Negative for hearing loss, ear pain, congestion, rhinorrhea, sneezing, mouth sores, trouble swallowing, neck pain, neck stiffness, dental problem, voice change, sinus pressure and tinnitus.   Eyes: Negative for photophobia, pain, redness and visual disturbance.  Respiratory: Negative for apnea, cough, choking, chest tightness, shortness of breath and wheezing.   Cardiovascular: Negative for chest pain, palpitations and leg swelling.  Gastrointestinal: Positive for diarrhea (intermittent). Negative for nausea, vomiting, abdominal pain, constipation, blood in stool, abdominal distention, anal bleeding ( anal itching) and rectal pain.  Genitourinary: Negative for dysuria, urgency, frequency, hematuria, flank pain, decreased urine volume, discharge, penile swelling, scrotal swelling, difficulty urinating, genital sores and testicular pain.  Musculoskeletal: Negative for myalgias, back pain, joint swelling, arthralgias and gait problem.  Skin: Negative for color change, rash and wound.  Neurological: Negative for dizziness, tremors, seizures, syncope, facial asymmetry, speech difficulty, weakness, light-headedness, numbness and headaches.  Hematological: Negative for adenopathy. Does not bruise/bleed easily.  Psychiatric/Behavioral: Negative for suicidal ideas, hallucinations, behavioral problems, confusion, sleep disturbance, self-injury, dysphoric mood, decreased concentration and agitation. The patient is not nervous/anxious.        Objective:   Physical Exam  Constitutional: He appears well-developed and well-nourished.  Weight 183  HENT:  Head: Normocephalic and atraumatic.  Right Ear: External ear normal.  Left Ear: External ear normal.  Nose: Nose normal.  Mouth/Throat: Oropharynx is clear and moist.  Eyes: Conjunctivae and EOM are normal. Pupils are equal, round, and reactive to light. No scleral icterus.  Neck: Normal range of motion. Neck supple. No  JVD present. No thyromegaly present.  Cardiovascular: Regular rhythm, normal [name] sounds and intact distal pulses.  Exam reveals no gallop and no friction rub.   No murmur heard. Decrease right posterior tibial pulse  Pulmonary/Chest: Effort normal and breath sounds normal. He exhibits no tenderness.  Abdominal: Soft. Bowel sounds are normal. He exhibits no distension and no mass. There is no tenderness.  Genitourinary: Penis normal.  Musculoskeletal: Normal range of motion. He exhibits no edema and no tenderness.  Lymphadenopathy:    He has no cervical adenopathy.  Neurological: He is alert. He has normal reflexes. No cranial nerve deficit. Coordination normal.  Skin: Skin is warm and dry. No rash noted.  Psychiatric: He has a normal mood and affect. His behavior is normal.          Assessment & Plan:  Preventive health exam Hypertension well controlled BPH with elevated PSA. Followup neurology Colonic Polyps.

## 2012-10-11 ENCOUNTER — Ambulatory Visit (INDEPENDENT_AMBULATORY_CARE_PROVIDER_SITE_OTHER): Payer: Medicare Other | Admitting: Family Medicine

## 2012-10-11 ENCOUNTER — Encounter: Payer: Self-pay | Admitting: Family Medicine

## 2012-10-11 VITALS — BP 134/90 | Temp 98.6°F | Wt 192.0 lb

## 2012-10-11 DIAGNOSIS — W57XXXA Bitten or stung by nonvenomous insect and other nonvenomous arthropods, initial encounter: Secondary | ICD-10-CM

## 2012-10-11 DIAGNOSIS — T148 Other injury of unspecified body region: Secondary | ICD-10-CM

## 2012-10-11 DIAGNOSIS — R51 Headache: Secondary | ICD-10-CM

## 2012-10-11 MED ORDER — DOXYCYCLINE HYCLATE 100 MG PO CAPS: 100.0000 mg | ORAL_CAPSULE | Freq: Two times a day (BID) | ORAL | Status: DC

## 2012-10-11 NOTE — Progress Notes (Signed)
Chief Complaint  Patient presents with  . Tick Removal    HPI:  Acute visit for tick bite: -bit by tick about 10 days ago, removed, may have been on for about 1-2 days -last few days has just felt a little tired and a little HA and neck pain after long drive to [place] -feels fine otherwise -denies: NVD, fever, rash, CP, SOB  ROS: See pertinent positives and negatives per HPI.  Past Medical History  Diagnosis Date  . BENIGN PROSTATIC HYPERTROPHY 05/07/2009  . COLONIC POLYPS, HX OF 05/07/2009  . HYPERLIPIDEMIA 05/07/2009  . HYPERTENSION 05/07/2009    Family History  Problem Relation Age of Onset  . Hyperlipidemia Mother   . Hypertension Mother   . Stroke Mother   . Hyperlipidemia Father   . Hypertension Father   . Stroke Father     History   Social History  . Marital Status: Married    Spouse Name: N/A    Number of Children: N/A  . Years of Education: N/A   Social History Main Topics  . Smoking status: Never Smoker   . Smokeless tobacco: Never Used  . Alcohol Use: Yes  . Drug Use: No  . Sexually Active: None   Other Topics Concern  . None   Social History Narrative  . None    Current outpatient prescriptions:amLODipine (NORVASC) 5 MG tablet, Take 1 tablet (5 mg total) by mouth daily., Disp: 90 tablet, Rfl: 3;  aspirin 81 MG tablet, Take 81 mg by mouth daily.  , Disp: , Rfl: ;  tamsulosin (FLOMAX) 0.4 MG CAPS, Take 1 capsule (0.4 mg total) by mouth daily., Disp: 90 capsule, Rfl: 6;  doxycycline (VIBRAMYCIN) 100 MG capsule, Take 1 capsule (100 mg total) by mouth 2 (two) times daily., Disp: 20 capsule, Rfl: 0  EXAM:  Filed Vitals:   10/11/12 0919  BP: 134/90  Temp: 98.6 F (37 C)    Body mass index is 31.45 kg/(m^2).  GENERAL: vitals reviewed and listed above, alert, oriented, appears well hydrated and in no acute distress  HEENT: atraumatic, conjunttiva clear, PERRLA, no obvious abnormalities on inspection of external nose and ears  NECK: no obvious  masses on inspection, normal movement head and neck, no meningeal signs  LUNGS: clear to auscultation bilaterally, no wheezes, rales or rhonchi, good air movement  CV: HRRR, no peripheral edema  MS: moves all extremities without noticeable abnormality  PSYCH: pleasant and cooperative, no obvious depression or anxiety  SKIN: no rash  ASSESSMENT AND PLAN:  Discussed the following assessment and plan:  Tick bite - Plan: doxycycline (VIBRAMYCIN) 100 MG capsule  Headache(784.0) - Plan: doxycycline (VIBRAMYCIN) 100 MG capsule  -very mild symptoms of mild HA and fatigue after long car ride and suspect more from travel then from tick borne illness. However given story of tick bite, time frame will cover with doxy in case of mild tick borne illness - start immediately doxy 100mg  bid (discussed risks). Return and ED precautions - discussed signs/symtoms of tick borne illness. -Patient advised to return or notify a doctor immediately if symptoms worsen or persist or new concerns arise.  There are no Patient Instructions on file for this visit.   Kriste Basque R.

## 2013-01-13 ENCOUNTER — Other Ambulatory Visit: Payer: Self-pay

## 2013-10-15 ENCOUNTER — Other Ambulatory Visit: Payer: Self-pay | Admitting: Internal Medicine

## 2013-10-20 ENCOUNTER — Other Ambulatory Visit (INDEPENDENT_AMBULATORY_CARE_PROVIDER_SITE_OTHER): Payer: Medicare Other

## 2013-10-20 DIAGNOSIS — Z0000 Encounter for general adult medical examination without abnormal findings: Secondary | ICD-10-CM

## 2013-10-20 DIAGNOSIS — E785 Hyperlipidemia, unspecified: Secondary | ICD-10-CM

## 2013-10-20 DIAGNOSIS — I10 Essential (primary) hypertension: Secondary | ICD-10-CM

## 2013-10-20 LAB — CBC WITH DIFFERENTIAL/PLATELET
BASOS ABS: 0.2 10*3/uL — AB (ref 0.0–0.1)
Basophils Relative: 1.7 % (ref 0.0–3.0)
EOS ABS: 1 10*3/uL — AB (ref 0.0–0.7)
EOS PCT: 10.4 % — AB (ref 0.0–5.0)
HEMATOCRIT: 44.7 % (ref 39.0–52.0)
Hemoglobin: 15 g/dL (ref 13.0–17.0)
LYMPHS ABS: 2.6 10*3/uL (ref 0.7–4.0)
Lymphocytes Relative: 28.5 % (ref 12.0–46.0)
MCHC: 33.6 g/dL (ref 30.0–36.0)
MCV: 90.1 fl (ref 78.0–100.0)
MONO ABS: 0.7 10*3/uL (ref 0.1–1.0)
Monocytes Relative: 7.4 % (ref 3.0–12.0)
NEUTROS PCT: 52 % (ref 43.0–77.0)
Neutro Abs: 4.8 10*3/uL (ref 1.4–7.7)
PLATELETS: 243 10*3/uL (ref 150.0–400.0)
RBC: 4.96 Mil/uL (ref 4.22–5.81)
RDW: 13.3 % (ref 11.5–15.5)
WBC: 9.2 10*3/uL (ref 4.0–10.5)

## 2013-10-20 LAB — POCT URINALYSIS DIPSTICK
Bilirubin, UA: NEGATIVE
GLUCOSE UA: NEGATIVE
Ketones, UA: NEGATIVE
Nitrite, UA: NEGATIVE
PH UA: 6.5
PROTEIN UA: NEGATIVE
SPEC GRAV UA: 1.015
UROBILINOGEN UA: 0.2

## 2013-10-20 LAB — BASIC METABOLIC PANEL
BUN: 25 mg/dL — AB (ref 6–23)
CHLORIDE: 103 meq/L (ref 96–112)
CO2: 29 mEq/L (ref 19–32)
CREATININE: 1 mg/dL (ref 0.4–1.5)
Calcium: 9.2 mg/dL (ref 8.4–10.5)
GFR: 78.24 mL/min (ref >60.00)
GLUCOSE: 83 mg/dL (ref 70–99)
Potassium: 4.7 mEq/L (ref 3.5–5.1)
Sodium: 139 mEq/L (ref 135–145)

## 2013-10-20 LAB — HEPATIC FUNCTION PANEL
ALBUMIN: 3.8 g/dL (ref 3.5–5.2)
ALT: 37 U/L (ref 0–53)
AST: 31 U/L (ref 0–37)
Alkaline Phosphatase: 66 U/L (ref 39–117)
Bilirubin, Direct: 0.1 mg/dL (ref 0.0–0.3)
Total Bilirubin: 0.6 mg/dL (ref 0.2–1.2)
Total Protein: 6.4 g/dL (ref 6.0–8.3)

## 2013-10-20 LAB — LIPID PANEL
CHOL/HDL RATIO: 3
Cholesterol: 211 mg/dL — ABNORMAL HIGH (ref 0–200)
HDL: 62.8 mg/dL (ref >39.00)
LDL CALC: 135 mg/dL — AB (ref 0–99)
NonHDL: 148.2
TRIGLYCERIDES: 67 mg/dL (ref 0.0–149.0)
VLDL: 13.4 mg/dL (ref 0.0–40.0)

## 2013-10-20 LAB — PSA: PSA: 9.89 ng/mL — ABNORMAL HIGH (ref 0.10–4.00)

## 2013-10-20 LAB — TSH: TSH: 1.24 u[IU]/mL (ref 0.35–4.50)

## 2013-10-27 ENCOUNTER — Encounter: Payer: Self-pay | Admitting: Internal Medicine

## 2013-10-27 ENCOUNTER — Ambulatory Visit (INDEPENDENT_AMBULATORY_CARE_PROVIDER_SITE_OTHER): Payer: Medicare Other | Admitting: Internal Medicine

## 2013-10-27 VITALS — BP 130/80 | HR 68 | Temp 98.0°F | Resp 20 | Ht 65.5 in | Wt 193.0 lb

## 2013-10-27 DIAGNOSIS — N400 Enlarged prostate without lower urinary tract symptoms: Secondary | ICD-10-CM

## 2013-10-27 DIAGNOSIS — R972 Elevated prostate specific antigen [PSA]: Secondary | ICD-10-CM

## 2013-10-27 DIAGNOSIS — I10 Essential (primary) hypertension: Secondary | ICD-10-CM

## 2013-10-27 DIAGNOSIS — Z23 Encounter for immunization: Secondary | ICD-10-CM

## 2013-10-27 DIAGNOSIS — Z0000 Encounter for general adult medical examination without abnormal findings: Secondary | ICD-10-CM

## 2013-10-27 DIAGNOSIS — E785 Hyperlipidemia, unspecified: Secondary | ICD-10-CM

## 2013-10-27 NOTE — Progress Notes (Signed)
Patient ID: Joel [name], male   DOB: [birth year], 68 y.o.   MRN: [id number]  Subjective:    Patient ID: Joel [name], male    DOB: [birth year], 68 y.o.   MRN: [id number]  HPI 9 -year-old patient who is seen today for a preventive health examination.   He is followed by urology and did have a followup colonoscopy in in October of 2013 He has treated hypertension history colonic polyps BPH and elevated PSA. He is doing quite well.  Family history- a sister has had an unprovoked pulmonary embolism   Preventive Screening-Counseling & Management  Alcohol-Tobacco  Smoking Status: never  Caffeine-Diet-Exercise  Does Patient Exercise: yes   Allergies (verified):  No Known Drug Allergies   Past History:  Past Medical History:  Colonic polyps, hx of  Hyperlipidemia  Hypertension  Benign prostatic hypertrophy  elevated PSA   Past Surgical History:  prostate biopsy, September 2010  severed [employer] tendon (L thumb) 2002  Sinus surgery 2004  Colonoscopy. 2008  2013   Family History:   father died age 50, history of hypertension, dyslipidemia, cerebral aneurysm  mother died age 16 from a ruptured abdominal aortic aneurysm history of dyslipidemia and hypertension  Three sisters are well ; one with history of unprovoked pulmonary embolism maternal grandmother pancreatic cancer   Social History:   Occupation: Armed forces operational officer  Married  Never Smoked  Regular exercise-yes  Smoking Status: never  Does Patient Exercise: yes  1. Risk factors, based on past  M,S,F history- cardiovascular risk factors include hypertension only  2.  Physical activities: Fairly active without restrictions does walk one or 2 miles 3 times weekly. Bikes occasionally  3.  Depression/mood: History of anxiety disorder which has resolved;  has been on sertraline in the past  4.  Hearing: No deficits  5.  ADL's: Independent  6.  Fall risk: Low  7.  Home safety: No problems identified  8.  Height weight,  and visual acuity; height and weight stable no change in visual acuity  9.  Counseling: Heart healthy diet regular exercise modest weight loss all encouraged  10. Lab orders based on risk factors: Laboratory profile reviewed  11. Referral : Followup urology  12. Care plan: Continue present regimen and home blood pressure monitoring encouraged  13. Cognitive assessment: Alert and oriented with normal affect. No cognitive dysfunction  14.  Preventive services will include annual exams and followup colonoscopy  15.  Provider list updated-followup urology next month as scheduled; GI followup for a followup colonoscopy in 3 years  Past Medical History  Diagnosis Date  . BENIGN PROSTATIC HYPERTROPHY 05/07/2009  . COLONIC POLYPS, HX OF 05/07/2009  . HYPERLIPIDEMIA 05/07/2009  . HYPERTENSION 05/07/2009    History   Social History  . Marital Status: Married    Spouse Name: N/A    Number of Children: N/A  . Years of Education: N/A   Occupational History  . Not on file.   Social History Main Topics  . Smoking status: Never Smoker   . Smokeless tobacco: Never Used  . Alcohol Use: Yes  . Drug Use: No  . Sexual Activity: Not on file   Other Topics Concern  . Not on file   Social History Narrative  . No narrative on file    Past Surgical History  Procedure Laterality Date  . Nasal sinus surgery    . Hand tendon surgery      Left thumb    Family History  Problem Relation  Age of Onset  . Hyperlipidemia Mother   . Hypertension Mother   . Stroke Mother   . Hyperlipidemia Father   . Hypertension Father   . Stroke Father     No Known Allergies  Current Outpatient Prescriptions on File Prior to Visit  Medication Sig Dispense Refill  . amLODipine (NORVASC) 5 MG tablet TAKE 1 TABLET BY MOUTH DAILY  90 tablet  1  . aspirin 81 MG tablet Take 81 mg by mouth daily.        . tamsulosin (FLOMAX) 0.4 MG CAPS Take 1 capsule (0.4 mg total) by mouth daily.  90 capsule  6   No  current facility-administered medications on file prior to visit.    BP 130/80  Pulse 68  Temp(Src) 98 F (36.7 C) (Oral)  Resp 20  Ht 5' 5.5" (1.664 m)  Wt 193 lb (87.544 kg)  BMI 31.62 kg/m2  SpO2 97%         Review of Systems  Constitutional: Negative for fever, chills, activity change, appetite change and fatigue.  HENT: Negative for congestion, dental problem, ear pain, hearing loss, mouth sores, rhinorrhea, sinus pressure, sneezing, tinnitus, trouble swallowing and voice change.   Eyes: Negative for photophobia, pain, redness and visual disturbance.  Respiratory: Negative for apnea, cough, choking, chest tightness, shortness of breath and wheezing.   Cardiovascular: Negative for chest pain, palpitations and leg swelling.  Gastrointestinal: Positive for diarrhea (intermittent). Negative for nausea, vomiting, abdominal pain, constipation, blood in stool, abdominal distention, anal bleeding ( anal itching) and rectal pain.  Genitourinary: Negative for dysuria, urgency, frequency, hematuria, flank pain, decreased urine volume, discharge, penile swelling, scrotal swelling, difficulty urinating, genital sores and testicular pain.  Musculoskeletal: Negative for arthralgias, back pain, gait problem, joint swelling, myalgias, neck pain and neck stiffness.  Skin: Negative for color change, rash and wound.  Neurological: Negative for dizziness, tremors, seizures, syncope, facial asymmetry, speech difficulty, weakness, light-headedness, numbness and headaches.  Hematological: Negative for adenopathy. Does not bruise/bleed easily.  Psychiatric/Behavioral: Negative for suicidal ideas, hallucinations, behavioral problems, confusion, sleep disturbance, self-injury, dysphoric mood, decreased concentration and agitation. The patient is not nervous/anxious.        Objective:   Physical Exam  Constitutional: He appears well-developed and well-nourished.  Weight 183  HENT:  Head:  Normocephalic and atraumatic.  Right Ear: External ear normal.  Left Ear: External ear normal.  Nose: Nose normal.  Mouth/Throat: Oropharynx is clear and moist.  Eyes: Conjunctivae and EOM are normal. Pupils are equal, round, and reactive to light. No scleral icterus.  Neck: Normal range of motion. Neck supple. No JVD present. No thyromegaly present.  Cardiovascular: Regular rhythm, normal heart sounds and intact distal pulses.  Exam reveals no gallop and no friction rub.   No murmur heard. Pulmonary/Chest: Effort normal and breath sounds normal. He exhibits no tenderness.  Abdominal: Soft. Bowel sounds are normal. He exhibits no distension and no mass. There is no tenderness.  Genitourinary: Penis normal.  Musculoskeletal: Normal range of motion. He exhibits no edema and no tenderness.  Lymphadenopathy:    He has no cervical adenopathy.  Neurological: He is alert. He has normal reflexes. No cranial nerve deficit. Coordination normal.  Skin: Skin is warm and dry. No rash noted.  Psychiatric: He has a normal mood and affect. His behavior is normal.          Assessment & Plan:  Preventive health exam Hypertension well controlled BPH with elevated PSA. Followup neurology Colonic Polyps.

## 2013-10-27 NOTE — Patient Instructions (Addendum)
Limit your sodium (Salt) intake    It is important that you exercise regularly, at least 20 minutes 3 to 4 times per week.  If you develop chest pain or shortness of breath seek  medical attention.  You need to lose weight.  Consider a lower calorie diet and regular exercise.  Health Maintenance A healthy lifestyle and preventative care can promote health and wellness.  Maintain regular health, dental, and eye exams.  Eat a healthy diet. Foods like vegetables, fruits, whole grains, low-fat dairy products, and lean protein foods contain the nutrients you need and are low in calories. Decrease your intake of foods high in solid fats, added sugars, and salt. Get information about a proper diet from your health care provider, if necessary.  Regular physical exercise is one of the most important things you can do for your health. Most adults should get at least 150 minutes of moderate-intensity exercise (any activity that increases your heart rate and causes you to sweat) each week. In addition, most adults need muscle-strengthening exercises on 2 or more days a week.   Maintain a healthy weight. The body mass index (BMI) is a screening tool to identify possible weight problems. It provides an estimate of body fat based on height and weight. Your health care provider can find your BMI and can help you achieve or maintain a healthy weight. For males 20 years and older:  A BMI below 18.5 is considered underweight.  A BMI of 18.5 to 24.9 is normal.  A BMI of 25 to 29.9 is considered overweight.  A BMI of 30 and above is considered obese.  Maintain normal blood lipids and cholesterol by exercising and minimizing your intake of saturated fat. Eat a balanced diet with plenty of fruits and vegetables. Blood tests for lipids and cholesterol should begin at age 20 and be repeated every 5 years. If your lipid or cholesterol levels are high, you are over age 50, or you are at high risk for heart disease,  you may need your cholesterol levels checked more frequently.Ongoing high lipid and cholesterol levels should be treated with medicines if diet and exercise are not working.  If you smoke, find out from your health care provider how to quit. If you do not use tobacco, do not start.  Lung cancer screening is recommended for adults aged 55-80 years who are at high risk for developing lung cancer because of a history of smoking. A yearly low-dose CT scan of the lungs is recommended for people who have at least a 30-pack-year history of smoking and are current smokers or have quit within the past 15 years. A pack year of smoking is smoking an average of 1 pack of cigarettes a day for 1 year (for example, a 30-pack-year history of smoking could mean smoking 1 pack a day for 30 years or 2 packs a day for 15 years). Yearly screening should continue until the smoker has stopped smoking for at least 15 years. Yearly screening should be stopped for people who develop a health problem that would prevent them from having lung cancer treatment.  If you choose to drink alcohol, do not have more than 2 drinks per day. One drink is considered to be 12 oz (360 mL) of beer, 5 oz (150 mL) of wine, or 1.5 oz (45 mL) of liquor.  Avoid the use of street drugs. Do not share needles with anyone. Ask for help if you need support or instructions about stopping the use   High blood pressure causes heart disease and increases the risk of stroke. Blood pressure should be checked at least every 1-2 years. Ongoing high blood pressure should be treated with medicines if weight loss and exercise are not effective.  If you are 33-2 years old, ask your health care provider if you should take aspirin to prevent heart disease.  Diabetes screening involves taking a blood sample to check your fasting blood sugar level. This should be done once every 3 years after age 43 if you are at a normal weight and without risk factors for  diabetes. Testing should be considered at a younger age or be carried out more frequently if you are overweight and have at least 1 risk factor for diabetes.  Colorectal cancer can be detected and often prevented. Most routine colorectal cancer screening begins at the age of 79 and continues through age 29. However, your health care provider may recommend screening at an earlier age if you have risk factors for colon cancer. On a yearly basis, your health care provider may provide home test kits to check for hidden blood in the stool. A small camera at the end of a tube may be used to directly examine the colon (sigmoidoscopy or colonoscopy) to detect the earliest forms of colorectal cancer. Talk to your health care provider about this at age 58 when routine screening begins. A direct exam of the colon should be repeated every 5-10 years through age 50, unless early forms of precancerous polyps or small growths are found.  People who are at an increased risk for hepatitis B should be screened for this virus. You are considered at high risk for hepatitis B if:  You were born in a country where hepatitis B occurs often. Talk with your health care provider about which countries are considered high risk.  Your parents were born in a high-risk country and you have not received a shot to protect against hepatitis B (hepatitis B vaccine).  You have HIV or AIDS.  You use needles to inject street drugs.  You live with, or have sex with, someone who has hepatitis B.  You are a man who has sex with other men (MSM).  You get hemodialysis treatment.  You take certain medicines for conditions like cancer, organ transplantation, and autoimmune conditions.  Hepatitis C blood testing is recommended for all people born from [birth year] through 1964 and any individual with known risk factors for hepatitis C.  Healthy men should no longer receive prostate-specific antigen (PSA) blood tests as part of routine cancer  screening. Talk to your health care provider about prostate cancer screening.  Testicular cancer screening is not recommended for adolescents or adult males who have no symptoms. Screening includes self-exam, a health care provider exam, and other screening tests. Consult with your health care provider about any symptoms you have or any concerns you have about testicular cancer.  Practice safe sex. Use condoms and avoid high-risk sexual practices to reduce the spread of sexually transmitted infections (STIs).  You should be screened for STIs, including gonorrhea and chlamydia if:  You are sexually active and are younger than 24 years.  You are older than 24 years, and your health care provider tells you that you are at risk for this type of infection.  Your sexual activity has changed since you were last screened, and you are at an increased risk for chlamydia or gonorrhea. Ask your health care provider if you are at risk.  If you are  at risk of being infected with HIV, it is recommended that you take a prescription medicine daily to prevent HIV infection. This is called pre-exposure prophylaxis (PrEP). You are considered at risk if:  You are a man who has sex with other men (MSM).  You are a heterosexual man who is sexually active with multiple partners.  You take drugs by injection.  You are sexually active with a partner who has HIV.  Talk with your health care provider about whether you are at high risk of being infected with HIV. If you choose to begin PrEP, you should first be tested for HIV. You should then be tested every 3 months for as long as you are taking PrEP.  Use sunscreen. Apply sunscreen liberally and repeatedly throughout the day. You should seek shade when your shadow is shorter than you. Protect yourself by wearing long sleeves, pants, a wide-brimmed hat, and sunglasses year round whenever you are outdoors.  Tell your health care provider of new moles or changes in  moles, especially if there is a change in shape or color. Also, tell your health care provider if a mole is larger than the size of a pencil eraser.  A one-time screening for abdominal aortic aneurysm (AAA) and surgical repair of large AAAs by ultrasound is recommended for men aged 4-75 years who are current or former smokers.  Stay current with your vaccines (immunizations). Document Released: 08/23/2007 Document Revised: 03/01/2013 Document Reviewed: 07/22/2010 [facility] Patient Information 2015 [place], [place]. This information is not intended to replace advice given to you by your health care provider. Make sure you discuss any questions you have with your health care provider. Cardiac Diet This diet can help prevent heart disease and stroke. Many factors influence your heart health, including eating and exercise habits. Coronary risk rises a lot with abnormal blood fat (lipid) levels. Cardiac meal planning includes limiting unhealthy fats, increasing healthy fats, and making other small dietary changes. General guidelines are as follows:  Adjust calorie intake to reach and maintain desirable body weight.  Limit total fat intake to less than 30% of total calories. Saturated fat should be less than 7% of calories.  Saturated fats are found in animal products and in some vegetable products. Saturated vegetable fats are found in coconut oil, cocoa butter, palm oil, and palm kernel oil. Read labels carefully to avoid these products as much as possible. Use butter in moderation. Choose tub margarines and oils that have 2 grams of fat or less. Good cooking oils are canola and olive oils.  Practice low-fat cooking techniques. Do not fry food. Instead, broil, bake, boil, steam, grill, roast on a rack, stir-fry, or microwave it. Other fat reducing suggestions include:  Remove the skin from poultry.  Remove all visible fat from meats.  Skim the fat off stews, soups, and gravies before serving  them.  Steam vegetables in water or broth instead of sauting them in fat.  Avoid foods with trans fat (or hydrogenated oils), such as commercially fried foods and commercially baked goods. Commercial shortening and deep-frying fats will contain trans fat.  Increase intake of fruits, vegetables, whole grains, and legumes to replace foods high in fat.  Increase consumption of nuts, legumes, and seeds to at least 4 servings weekly. One serving of a legume equals  cup, and 1 serving of nuts or seeds equals  cup.  Choose whole grains more often. Have 3 servings per day (a serving is 1 ounce [oz]).  Eat 4 to  5 servings of vegetables per day. A serving of vegetables is 1 cup of raw leafy vegetables;  cup of raw or cooked cut-up vegetables;  cup of vegetable juice.  Eat 4 to 5 servings of fruit per day. A serving of fruit is 1 medium whole fruit;  cup of dried fruit;  cup of fresh, frozen, or canned fruit;  cup of 100% fruit juice.  Increase your intake of dietary fiber to 20 to 30 grams per day. Insoluble fiber may help lower your risk of heart disease and may help curb your appetite.  Soluble fiber binds cholesterol to be removed from the blood. Foods high in soluble fiber are dried beans, citrus fruits, oats, apples, bananas, broccoli, Brussels sprouts, and eggplant.  Try to include foods fortified with plant sterols or stanols, such as yogurt, breads, juices, or margarines. Choose several fortified foods to achieve a daily intake of 2 to 3 grams of plant sterols or stanols.  Foods with omega-3 fats can help reduce your risk of heart disease. Aim to have a 3.5 oz portion of fatty fish twice per week, such as salmon, mackerel, albacore tuna, sardines, lake trout, or herring. If you wish to take a fish oil supplement, choose one that contains 1 gram of both DHA and EPA.  Limit processed meats to 2 servings (3 oz portion) weekly.  Limit the sodium in your diet to 1500 milligrams (mg) per  day. If you have high blood pressure, talk to a registered dietitian about a DASH (Dietary Approaches to Stop Hypertension) eating plan.  Limit sweets and beverages with added sugar, such as soda, to no more than 5 servings per week. One serving is:   1 tablespoon sugar.  1 tablespoon jelly or jam.   cup sorbet.  1 cup lemonade.   cup regular soda. CHOOSING FOODS Starches  Allowed: Breads: All kinds (wheat, rye, raisin, white, oatmeal, [place], [place], and English muffin bread). Low-fat rolls: English muffins, frankfurter and hamburger buns, bagels, pita bread, tortillas (not fried). Pancakes, waffles, biscuits, and muffins made with recommended oil.  Avoid: Products made with saturated or trans fats, oils, or whole milk products. Butter rolls, cheese breads, croissants. Commercial doughnuts, muffins, sweet rolls, biscuits, waffles, pancakes, store-bought mixes. Crackers  Allowed: Low-fat crackers and snacks: Animal, graham, rye, saltine (with recommended oil, no lard), oyster, and matzo crackers. Bread sticks, melba toast, rusks, flatbread, pretzels, and light popcorn.  Avoid: High-fat crackers: cheese crackers, butter crackers, and those made with coconut, palm oil, or trans fat (hydrogenated oils). Buttered popcorn. Cereals  Allowed: Hot or cold whole-grain cereals.  Avoid: Cereals containing coconut, hydrogenated vegetable fat, or animal fat. Potatoes / Pasta / Rice  Allowed: All kinds of potatoes, rice, and pasta (such as macaroni, spaghetti, and noodles).  Avoid: Pasta or rice prepared with cream sauce or high-fat cheese. Chow mein noodles, [place] fries. Vegetables  Allowed: All vegetables and vegetable juices.  Avoid: Fried vegetables. Vegetables in cream, butter, or high-fat cheese sauces. Limit coconut. Fruit in cream or custard. Protein  Allowed: Limit your intake of meat, seafood, and poultry to no more than 6 oz (cooked weight) per day. All lean, well-trimmed  beef, veal, pork, and lamb. All chicken and [place] without skin. All fish and shellfish. Wild game: wild duck, rabbit, pheasant, and venison. Egg whites or low-cholesterol egg substitutes may be used as desired. Meatless dishes: recipes with dried beans, peas, lentils, and tofu (soybean curd). Seeds and nuts: all seeds and most nuts.  Avoid:  Prime grade and other heavily marbled and fatty meats, such as short ribs, spare ribs, rib eye roast or steak, frankfurters, sausage, bacon, and high-fat luncheon meats, mutton. Caviar. Commercially fried fish. Domestic duck, goose, venison sausage. Organ meats: liver, gizzard, heart, chitterlings, brains, kidney, sweetbreads. Dairy  Allowed: Low-fat cheeses: nonfat or low-fat cottage cheese (1% or 2% fat), cheeses made with part skim milk, such as mozzarella, farmers, string, or ricotta. (Cheeses should be labeled no more than 2 to 6 grams fat per oz.). Skim (or 1%) milk: liquid, powdered, or evaporated. Buttermilk made with low-fat milk. Drinks made with skim or low-fat milk or cocoa. Chocolate milk or cocoa made with skim or low-fat (1%) milk. Nonfat or low-fat yogurt.  Avoid: Whole milk cheeses, including colby, cheddar, [place], Monte[place] Jack, [place], [place], [place], American, Swiss, and blue. Creamed cottage cheese, cream cheese. Whole milk and whole milk products, including buttermilk or yogurt made from whole milk, drinks made from whole milk. Condensed milk, evaporated whole milk, and 2% milk. Soups and Combination Foods  Allowed: Low-fat low-sodium soups: broth, dehydrated soups, homemade broth, soups with the fat removed, homemade cream soups made with skim or low-fat milk. Low-fat spaghetti, lasagna, chili, and Spanish rice if low-fat ingredients and low-fat cooking techniques are used.  Avoid: Cream soups made with whole milk, cream, or high-fat cheese. All other soups. Desserts and Sweets  Allowed: Sherbet, fruit ices, gelatins, meringues, and  angel food cake. Homemade desserts with recommended fats, oils, and milk products. Jam, jelly, honey, marmalade, sugars, and syrups. Pure sugar candy, such as gum drops, hard candy, jelly beans, marshmallows, mints, and small amounts of dark chocolate.  Avoid: Commercially prepared cakes, pies, cookies, frosting, pudding, or mixes for these products. Desserts containing whole milk products, chocolate, coconut, lard, palm oil, or palm kernel oil. Ice cream or ice cream drinks. Candy that contains chocolate, coconut, butter, hydrogenated fat, or unknown ingredients. Buttered syrups. Fats and Oils  Allowed: Vegetable oils: safflower, sunflower, corn, soybean, cottonseed, sesame, canola, olive, or peanut. Non-hydrogenated margarines. Salad dressing or mayonnaise: homemade or commercial, made with a recommended oil. Low or nonfat salad dressing or mayonnaise.  Limit added fats and oils to 6 to 8 tsp per day (includes fats used in cooking, baking, salads, and spreads on bread). Remember to count the "hidden fats" in foods.  Avoid: Solid fats and shortenings: butter, lard, salt pork, bacon drippings. Gravy containing meat fat, shortening, or suet. Cocoa butter, coconut. Coconut oil, palm oil, palm kernel oil, or hydrogenated oils: these ingredients are often used in bakery products, nondairy creamers, whipped toppings, candy, and commercially fried foods. Read labels carefully. Salad dressings made of unknown oils, sour cream, or cheese, such as blue cheese and Roquefort. Cream, all kinds: half-and-half, light, heavy, or whipping. Sour cream or cream cheese (even if "light" or low-fat). Nondairy cream substitutes: coffee creamers and sour cream substitutes made with palm, palm kernel, hydrogenated oils, or coconut oil. Beverages  Allowed: Coffee (regular or decaffeinated), tea. Diet carbonated beverages, mineral water. Alcohol: Check with your caregiver. Moderation is recommended.  Avoid: Whole milk, regular  sodas, and juice drinks with added sugar. Condiments  Allowed: All seasonings and condiments. Cocoa powder. "Cream" sauces made with recommended ingredients.  Avoid: Carob powder made with hydrogenated fats. SAMPLE MENU Breakfast   cup orange juice   cup oatmeal  1 slice toast  1 tsp margarine  1 cup skim milk Lunch  [place] sandwich with 2 oz [place], 2 slices bread  Lettuce  and tomato slices  Fresh fruit  Carrot sticks  Coffee or tea Snack  Fresh fruit or low-fat crackers Dinner  3 oz lean ground beef  1 baked potato  1 tsp margarine   cup asparagus  Lettuce salad  1 tbs non-creamy dressing   cup peach slices  1 cup skim milk Document Released: 12/04/2007 Document Revised: 08/26/2011 Document Reviewed: 04/26/2013 [employer] Patient Information 2015 [place], [place]. This information is not intended to replace advice given to you by your health care provider. Make sure you discuss any questions you have with your health care provider.  Followup urology as scheduled

## 2013-10-27 NOTE — Progress Notes (Signed)
Pre visit review using our clinic review tool, if applicable. No additional management support is needed unless otherwise documented below in the visit note. 

## 2013-10-28 ENCOUNTER — Telehealth: Payer: Self-pay | Admitting: Internal Medicine

## 2013-10-28 NOTE — Telephone Encounter (Signed)
Pt said he has a question he fail to ask doing his physical

## 2013-10-28 NOTE — Telephone Encounter (Signed)
Relevant patient education assigned to patient using Emmi. ° °

## 2013-10-31 NOTE — Telephone Encounter (Signed)
Spoke to pt, said her forgot to ask about a sleep apparatus that his dentist recommended, said the dentist sent a letter to Dr. Raliegh Ip. Told pt I do not see letter in his chart, would recommend he get letter and test results from Dentist and make an appointment to discuss and then Dr. Raliegh Ip can order correct study or apparatus. Pt verbalized understanding.

## 2014-04-07 ENCOUNTER — Other Ambulatory Visit: Payer: Self-pay | Admitting: Internal Medicine

## 2014-09-05 ENCOUNTER — Observation Stay (HOSPITAL_COMMUNITY)
Admission: EM | Admit: 2014-09-05 | Discharge: 2014-09-06 | Disposition: A | Discharge status: Home / Self Care | Payer: Medicare Other | Attending: Internal Medicine | Admitting: Internal Medicine

## 2014-09-05 ENCOUNTER — Observation Stay (HOSPITAL_COMMUNITY): Payer: Medicare Other

## 2014-09-05 ENCOUNTER — Emergency Department (HOSPITAL_COMMUNITY): Payer: Medicare Other

## 2014-09-05 ENCOUNTER — Encounter (HOSPITAL_COMMUNITY): Payer: Self-pay | Admitting: Emergency Medicine

## 2014-09-05 ENCOUNTER — Telehealth: Payer: Self-pay | Admitting: Internal Medicine

## 2014-09-05 DIAGNOSIS — Z79899 Other long term (current) drug therapy: Secondary | ICD-10-CM | POA: Diagnosis not present

## 2014-09-05 DIAGNOSIS — E785 Hyperlipidemia, unspecified: Secondary | ICD-10-CM

## 2014-09-05 DIAGNOSIS — R079 Chest pain, unspecified: Secondary | ICD-10-CM | POA: Diagnosis not present

## 2014-09-05 DIAGNOSIS — L739 Follicular disorder, unspecified: Secondary | ICD-10-CM | POA: Insufficient documentation

## 2014-09-05 DIAGNOSIS — N400 Enlarged prostate without lower urinary tract symptoms: Secondary | ICD-10-CM | POA: Diagnosis not present

## 2014-09-05 DIAGNOSIS — D72829 Elevated white blood cell count, unspecified: Secondary | ICD-10-CM | POA: Diagnosis not present

## 2014-09-05 DIAGNOSIS — Z7982 Long term (current) use of aspirin: Secondary | ICD-10-CM | POA: Insufficient documentation

## 2014-09-05 DIAGNOSIS — R0789 Other chest pain: Secondary | ICD-10-CM

## 2014-09-05 DIAGNOSIS — I10 Essential (primary) hypertension: Secondary | ICD-10-CM | POA: Diagnosis present

## 2014-09-05 LAB — CBC
HCT: 42.7 % (ref 39.0–52.0)
HCT: 41.2 % (ref 39.0–52.0)
Hemoglobin: 14.6 g/dL (ref 13.0–17.0)
Hemoglobin: 14.3 g/dL (ref 13.0–17.0)
MCH: 30.5 pg (ref 26.0–34.0)
MCH: 30.4 pg (ref 26.0–34.0)
MCHC: 34.2 g/dL (ref 30.0–36.0)
MCHC: 34.7 g/dL (ref 30.0–36.0)
MCV: 89.1 fL (ref 78.0–100.0)
MCV: 87.7 fL (ref 78.0–100.0)
Platelets: 217 10*3/uL (ref 150–400)
Platelets: 196 K/uL (ref 150–400)
RBC: 4.79 MIL/uL (ref 4.22–5.81)
RBC: 4.7 MIL/uL (ref 4.22–5.81)
RDW: 12.9 % (ref 11.5–15.5)
RDW: 12.9 % (ref 11.5–15.5)
WBC: 12.7 10*3/uL — AB (ref 4.0–10.5)
WBC: 11.9 K/uL — ABNORMAL HIGH (ref 4.0–10.5)

## 2014-09-05 LAB — BASIC METABOLIC PANEL
Anion gap: 7 (ref 5–15)
BUN: 16 mg/dL (ref 6–20)
CALCIUM: 9.1 mg/dL (ref 8.9–10.3)
CO2: 27 mmol/L (ref 22–32)
Chloride: 102 mmol/L (ref 101–111)
Creatinine, Ser: 0.94 mg/dL (ref 0.61–1.24)
GFR calc Af Amer: >60 mL/min (ref >60)
GFR calc non Af Amer: >60 mL/min (ref >60)
Glucose, Bld: 100 mg/dL — ABNORMAL HIGH (ref 65–99)
Potassium: 3.5 mmol/L (ref 3.5–5.1)
Sodium: 136 mmol/L (ref 135–145)

## 2014-09-05 LAB — I-STAT TROPONIN, ED: TROPONIN I, POC: 0.01 ng/mL (ref 0.00–0.08)

## 2014-09-05 LAB — D-DIMER, QUANTITATIVE (NOT AT ARMC)

## 2014-09-05 MED ORDER — ZOLPIDEM TARTRATE 5 MG PO TABS: 5.0000 mg | ORAL_TABLET | Freq: Every evening | ORAL | Status: DC | PRN

## 2014-09-05 MED ORDER — CEPHALEXIN 250 MG PO CAPS
250.0000 mg | ORAL_CAPSULE | Freq: Four times a day (QID) | ORAL | Status: DC
  Administered 2014-09-05 – 2014-09-06 (×2): 250 mg via ORAL
  Filled 2014-09-05 (×6): qty 1

## 2014-09-05 MED ORDER — HEPARIN SODIUM (PORCINE) 5000 UNIT/ML IJ SOLN
5000.0000 [IU] | Freq: Three times a day (TID) | INTRAMUSCULAR | Status: DC
  Administered 2014-09-05 – 2014-09-06 (×2): 5000 [IU] via SUBCUTANEOUS
  Filled 2014-09-05 (×2): qty 1

## 2014-09-05 MED ORDER — ASPIRIN EC 81 MG PO TBEC: 81.0000 mg | DELAYED_RELEASE_TABLET | Freq: Every day | ORAL | Status: DC

## 2014-09-05 MED ORDER — AMLODIPINE BESYLATE 5 MG PO TABS: 5.0000 mg | ORAL_TABLET | Freq: Every day | ORAL | Status: DC

## 2014-09-05 MED ORDER — IOHEXOL 350 MG/ML SOLN
100.0000 mL | Freq: Once | INTRAVENOUS | Status: AC | PRN
  Administered 2014-09-05: 100 mL via INTRAVENOUS

## 2014-09-05 MED ORDER — NITROGLYCERIN 2 % TD OINT
1.0000 [in_us] | TOPICAL_OINTMENT | Freq: Once | TRANSDERMAL | Status: AC
  Administered 2014-09-05: 1 [in_us] via TOPICAL
  Filled 2014-09-05: qty 30

## 2014-09-05 MED ORDER — ASPIRIN 325 MG PO TABS
325.0000 mg | ORAL_TABLET | Freq: Once | ORAL | Status: AC
  Administered 2014-09-05: 325 mg via ORAL
  Filled 2014-09-05: qty 1

## 2014-09-05 MED ORDER — ONDANSETRON HCL 4 MG/2ML IJ SOLN: 4.0000 mg | Freq: Four times a day (QID) | INTRAMUSCULAR | Status: DC | PRN

## 2014-09-05 MED ORDER — GI COCKTAIL ~~LOC~~: 30.0000 mL | Freq: Four times a day (QID) | ORAL | Status: DC | PRN

## 2014-09-05 MED ORDER — ACETAMINOPHEN 325 MG PO TABS: 650.0000 mg | ORAL_TABLET | ORAL | Status: DC | PRN

## 2014-09-05 MED ORDER — MORPHINE SULFATE 2 MG/ML IJ SOLN
2.0000 mg | INTRAMUSCULAR | Status: DC | PRN
Start: 2014-09-05 — End: 2014-09-06

## 2014-09-05 MED ORDER — TAMSULOSIN HCL 0.4 MG PO CAPS: 0.4000 mg | ORAL_CAPSULE | Freq: Every day | ORAL | Status: DC

## 2014-09-05 NOTE — ED Notes (Signed)
Pt remains in CT

## 2014-09-05 NOTE — ED Notes (Signed)
Per pt, states chest pain radiating to left arm, started around 1200, lightheaded and nauseated

## 2014-09-05 NOTE — H&P (Signed)
[facility] History and Physical  Joel [name] BWG:[id number] DOB: 02-Mar-1946 DOA: 09/05/2014  Referring physician: Charlesetta Shanks, MD PCP: Nyoka Cowden, MD   Chief Complaint: Chest Pain  HPI: Joel [name] is a 69 y.o. male with history of HTN presents with chest pain. Patient staets that he has had chest pain since around 11AM. Patient states that the pain was initially sharp in the chest followed by a dullness in the shoulder and numbness in the left arm. Patient states that he took an aspirin and now has a nitropaste which resoled the pain. Patient states that he had no SOB with the pain. There did seem to be a pleuritic component. He did travel to [place] by road on the 9th of June. Patient states that he has had no leg swelling noted. He has no abdominal pain no nausea or vomiting is noted. He states he has had no headaches other than after NTP. Patient states he has no prior history of CAD. He does not smoke and occasional ETOH use. In addition he noted a bump on his left shoulder which has become more pronounced and irritated with some drainage noted.  Review of Systems:  Complete systems reviewed Unremarkable other than HPI  Past Medical History  Diagnosis Date  . BENIGN PROSTATIC HYPERTROPHY 05/07/2009  . COLONIC POLYPS, HX OF 05/07/2009  . HYPERLIPIDEMIA 05/07/2009  . HYPERTENSION 05/07/2009   Past Surgical History  Procedure Laterality Date  . Nasal sinus surgery    . Hand tendon surgery      Left thumb   Social History:  reports that he has never smoked. He has never used smokeless tobacco. He reports that he drinks alcohol. He reports that he does not use illicit drugs.  No Known Allergies  Family History  Problem Relation Age of Onset  . Hyperlipidemia Mother   . Hypertension Mother   . Stroke Mother   . Hyperlipidemia Father   . Hypertension Father   . Stroke Father      Prior to Admission medications   Medication Sig Start Date End Date  Taking? Authorizing Provider  amLODipine (NORVASC) 5 MG tablet TAKE 1 TABLET BY MOUTH DAILY 04/07/14   Marletta Lor, MD  aspirin 81 MG tablet Take 81 mg by mouth daily.      Historical Provider, MD  tamsulosin (FLOMAX) 0.4 MG CAPS Take 1 capsule (0.4 mg total) by mouth daily. 09/24/12   Marletta Lor, MD   Physical Exam: Filed Vitals:   09/05/14 1805 09/05/14 1937 09/05/14 1941 09/05/14 2112  BP: 120/71  159/87 126/72  Pulse: 87 81 80 77  Temp: 98 F (36.7 C)     TempSrc: Oral     Resp: 24 24 20 20   SpO2: 98% 99% 100% 98%    Wt Readings from Last 3 Encounters:  10/27/13 87.544 kg (193 lb)  10/11/12 87.091 kg (192 lb)  09/24/12 85.73 kg (189 lb)    General:  Appears calm and comfortable Eyes: PERRL, normal lids, irises & conjunctiva ENT: grossly normal hearing, lips & tongue Neck: no LAD, masses or thyromegaly Cardiovascular: RRR, no m/r/g. No LE edema Respiratory: CTA bilaterally, no w/r/r. Normal respiratory effort. Abdomen: soft, ntnd Skin: Left shoulder blister noted with a little drainage Musculoskeletal: grossly normal tone BUE/BLE Psychiatric: grossly normal mood and affect, speech fluent and appropriate Neurologic: grossly non-focal.          Labs on Admission:  Basic Metabolic Panel:  Recent Labs Lab 09/05/14 1831  NA 136  K 3.5  CL 102  CO2 27  GLUCOSE 100*  BUN 16  CREATININE 0.94  CALCIUM 9.1   Liver Function Tests: No results for input(s): AST, ALT, ALKPHOS, BILITOT, PROT, ALBUMIN in the last 168 hours. No results for input(s): LIPASE, AMYLASE in the last 168 hours. No results for input(s): AMMONIA in the last 168 hours. CBC:  Recent Labs Lab 09/05/14 1831  WBC 12.7*  HGB 14.6  HCT 42.7  MCV 89.1  PLT 217   Cardiac Enzymes: No results for input(s): CKTOTAL, CKMB, CKMBINDEX, TROPONINI in the last 168 hours.  BNP (last 3 results) No results for input(s): BNP in the last 8760 hours.  ProBNP (last 3 results) No results for  input(s): PROBNP in the last 8760 hours.  CBG: No results for input(s): GLUCAP in the last 168 hours.  Radiological Exams on Admission: Dg Chest 2 View  09/05/2014   CLINICAL DATA:  69 year old male with a history of left-sided chest pain.  EXAM: CHEST - 2 VIEW  COMPARISON:  None.  FINDINGS: Cardiomediastinal silhouette projects within normal limits in size and contour. No confluent airspace disease, pneumothorax, or pleural effusion.  No displaced fracture.  Unremarkable appearance of the upper abdomen.  IMPRESSION: No radiographic evidence of acute cardiopulmonary disease.  Signed,  Dulcy Fanny. Earleen Newport, DO  [facility]  [facility] Radiology   Electronically Signed   By: Corrie Mckusick D.O.   On: 09/05/2014 18:49    EKG: Independently reviewed. NSR  Assessment/Plan Active Problems:   Essential hypertension   Hyperlipidemia   Chest pain   1. Chest Pain -admit for observation -check serial enzymes -will get echo in am -continue ASA -CTA ordered and negative for PE  2. HTN -will continue with amlodipine -monitor pressures  3. Hyperlipidemia -not on statins presently -check lipid panel  4. BPH -continue with flomax  5. Left Shoulder Blister -?etiology no history of bug bite etc. -has elevated WBC noted will start empirically on keflex for now    Code Status: Full Code (must indicate code status--if unknown or must be presumed, indicate so) DVT Prophylaxis:heparin Family Communication: none (indicate person spoken with, if applicable, with phone number if by telephone) Disposition Plan: Home (indicate anticipated LOS)  Time spent: 58min obs  [facility] Pager [phone]

## 2014-09-05 NOTE — Telephone Encounter (Signed)
[facility] Day - Client [place] [facility] Patient Name: Joel [name] DOB: January 26, 1946 Initial Comment Caller says he is having chest pains all day; including now Nurse Assessment Nurse: Owens Shark, RN, Max Date/Time [name] Time): 09/05/2014 4:44:15 PM Confirm and document reason for call. If symptomatic, describe symptoms. ---Caller states he is having sharp left chest pains all day; began this morning; including now . Aching in left shoulder and numbness in left arm. Has the patient traveled out of the country within the last 30 days? ---No Does the patient require triage? ---Yes Related visit to physician within the last 2 weeks? ---No Does the PT have any chronic conditions? (i.e. diabetes, asthma, etc.) ---Yes List chronic conditions. ---HTN, BHP Guidelines Guideline Title Affirmed Question Affirmed Notes Chest Pain [1] Chest pain lasts > 5 minutes AND [2] age > 15 Final Disposition User Call EMS 911 Now Owens Shark, [place], [place]

## 2014-09-05 NOTE — ED Provider Notes (Signed)
CSN: [id number]     Arrival date & time 09/05/14  1752 History   First MD Initiated Contact with Patient 09/05/14 1851     Chief Complaint  Patient presents with  . Chest Pain     (Consider location/radiation/quality/duration/timing/severity/associated sxs/prior Treatment) HPI Patient poor she developed chest pain at about noon today. He has pressure in the left side of the chest with radiation into his left shoulder and his left neck. At times he reports that the left arm felt slightly tingly or numb. This is been waxing and waning over the afternoon. He denies other associated symptoms. Patient denies any cardiac history. He does have borderline hypertension controlled with medications. There has not been any recent fever cough chills. No lower extremity swelling or pain. Past Medical History  Diagnosis Date  . BENIGN PROSTATIC HYPERTROPHY 05/07/2009  . COLONIC POLYPS, HX OF 05/07/2009  . HYPERLIPIDEMIA 05/07/2009  . HYPERTENSION 05/07/2009   Past Surgical History  Procedure Laterality Date  . Nasal sinus surgery    . Hand tendon surgery      Left thumb   Family History  Problem Relation Age of Onset  . Hyperlipidemia Mother   . Hypertension Mother   . Stroke Mother   . Hyperlipidemia Father   . Hypertension Father   . Stroke Father    History  Substance Use Topics  . Smoking status: Never Smoker   . Smokeless tobacco: Never Used  . Alcohol Use: Yes    Review of Systems 10 Systems reviewed and are negative for acute change except as noted in the HPI.    Allergies  Review of patient's allergies indicates no known allergies.  Home Medications   Prior to Admission medications   Medication Sig Start Date End Date Taking? Authorizing Provider  amLODipine (NORVASC) 5 MG tablet TAKE 1 TABLET BY MOUTH DAILY 04/07/14   Marletta Lor, MD  aspirin 81 MG tablet Take 81 mg by mouth daily.      Historical Provider, MD  tamsulosin (FLOMAX) 0.4 MG CAPS Take 1 capsule (0.4 mg  total) by mouth daily. 09/24/12   Marletta Lor, MD   BP 126/72 mmHg  Pulse 77  Temp(Src) 98 F (36.7 C) (Oral)  Resp 20  SpO2 98% Physical Exam  Constitutional: He is oriented to person, place, and time. He appears well-developed and well-nourished.  HENT:  Head: Normocephalic and atraumatic.  Eyes: EOM are normal. Pupils are equal, round, and reactive to light.  Neck: Neck supple.  Cardiovascular: Normal rate, regular rhythm, normal heart sounds and intact distal pulses.   Pulmonary/Chest: Effort normal and breath sounds normal.  Abdominal: Soft. Bowel sounds are normal. He exhibits no distension. There is no tenderness.  Musculoskeletal: Normal range of motion. He exhibits no edema.  Neurological: He is alert and oriented to person, place, and time. He has normal strength. Coordination normal. GCS eye subscore is 4. GCS verbal subscore is 5. GCS motor subscore is 6.  Skin: Skin is warm, dry and intact.  Psychiatric: He has a normal mood and affect.    ED Course  Procedures (including critical care time) Labs Review Labs Reviewed  CBC - Abnormal; Notable for the following:    WBC 12.7 (*)    All other components within normal limits  BASIC METABOLIC PANEL - Abnormal; Notable for the following:    Glucose, Bld 100 (*)    All other components within normal limits  D-DIMER, QUANTITATIVE (NOT AT [facility])  Randolm Idol, ED  Imaging Review Dg Chest 2 View  09/05/2014   CLINICAL DATA:  69 year old male with a history of left-sided chest pain.  EXAM: CHEST - 2 VIEW  COMPARISON:  None.  FINDINGS: Cardiomediastinal silhouette projects within normal limits in size and contour. No confluent airspace disease, pneumothorax, or pleural effusion.  No displaced fracture.  Unremarkable appearance of the upper abdomen.  IMPRESSION: No radiographic evidence of acute cardiopulmonary disease.  Signed,  Dulcy Fanny. Earleen Newport, DO  [facility]  [facility]  Radiology   Electronically Signed   By: Corrie Mckusick D.O.   On: 09/05/2014 18:49     EKG Interpretation   Date/Time:  Tuesday September 05 2014 18:03:54 EDT Ventricular Rate:  88 PR Interval:  157 QRS Duration: 94 QT Interval:  341 QTC Calculation: 412 R Axis:   -19 Text Interpretation:  Sinus rhythm no STEMI. lateral artifact Confirmed by  Johnney Killian, MD, Jeannie Done [phone]) on 09/05/2014 7:17:16 PM      MDM   Final diagnoses:  Other chest pain   Patient has had chest pain that is suggestive of ischemic type chest pain with left chest radiating to the arm and the neck. At this time cardiac enzymes are negative and first EKG does not show acute STEMI. Patient's vital signs are stable. He will be admitted for rule out of MI and further evaluation.    Charlesetta Shanks, MD 09/05/14 2133

## 2014-09-06 ENCOUNTER — Observation Stay (HOSPITAL_COMMUNITY): Payer: Medicare Other

## 2014-09-06 DIAGNOSIS — N400 Enlarged prostate without lower urinary tract symptoms: Secondary | ICD-10-CM | POA: Diagnosis not present

## 2014-09-06 DIAGNOSIS — R0789 Other chest pain: Secondary | ICD-10-CM

## 2014-09-06 DIAGNOSIS — E785 Hyperlipidemia, unspecified: Secondary | ICD-10-CM | POA: Diagnosis not present

## 2014-09-06 DIAGNOSIS — R079 Chest pain, unspecified: Secondary | ICD-10-CM | POA: Diagnosis not present

## 2014-09-06 DIAGNOSIS — I10 Essential (primary) hypertension: Secondary | ICD-10-CM

## 2014-09-06 DIAGNOSIS — L739 Follicular disorder, unspecified: Secondary | ICD-10-CM

## 2014-09-06 LAB — LIPID PANEL
CHOL/HDL RATIO: 2.8 ratio
CHOLESTEROL: 201 mg/dL — AB (ref 0–200)
HDL: 71 mg/dL (ref >40)
LDL Cholesterol: 120 mg/dL — ABNORMAL HIGH (ref 0–99)
Triglycerides: 49 mg/dL (ref <150)
VLDL: 10 mg/dL (ref 0–40)

## 2014-09-06 LAB — CREATININE, SERUM
CREATININE: 0.93 mg/dL (ref 0.61–1.24)
GFR calc Af Amer: >60 mL/min (ref >60)
GFR calc non Af Amer: >60 mL/min (ref >60)

## 2014-09-06 LAB — TROPONIN I
Troponin I: <0.03 ng/mL (ref <0.031)
Troponin I: <0.03 ng/mL (ref <0.031)

## 2014-09-06 MED ORDER — SODIUM CHLORIDE 0.9 % IV BOLUS (SEPSIS)
500.0000 mL | Freq: Once | INTRAVENOUS | Status: AC
  Administered 2014-09-06: 500 mL via INTRAVENOUS

## 2014-09-06 NOTE — Progress Notes (Signed)
Completed D/C teaching with patient and family. Answered all questions. Pt will be D/C with family in stable condition.

## 2014-09-06 NOTE — Discharge Summary (Signed)
Physician Discharge Summary  Joel [name] NGE:[id number] DOB: January 31, 1946 DOA: 09/05/2014  PCP: Nyoka Cowden, MD  Admit date: 09/05/2014 Discharge date: 09/09/2014  Time spent: 40 minutes  Discharge Condition: Stable Diet recommendation: Low sodium heart healthy  Discharge Diagnoses:  Principal Problem:   Chest pain Active Problems:   Essential hypertension   Hyperlipidemia   Folliculitis   History of present illness:  This is a 69 year old male with a history of hypertension who presented to the hospital with chest pain. Pain was in the center of his chest and sharp followed by radiation to his left shoulder along with numbness in the left arm. He had a CT scan of the chest in the ER which was negative for PE or any other acute etiology area he is a nonsmoker with no history of coronary artery disease.  Hospital Course:  Chest pain -EKG nonacute-3 sets of troponin negative-no recurrence of pain while in the hospital - no further workup at this time-if pain recurs he is advised to seek attention from his PCP  Folliculitis left shoulder -Area drained on its own and currently is covered with a scab-no infection noted    Discharge Exam: Filed Weights   09/05/14 2258  Weight: 81 kg (178 lb 9.2 oz)   Filed Vitals:   09/06/14 0742  BP: 118/78  Pulse:   Temp:   Resp:     General: AAO x 3, no distress Cardiovascular: RRR, no murmurs  Respiratory: clear to auscultation bilaterally GI: soft, non-tender, non-distended, bowel sound positive  Discharge Instructions You were cared for by a hospitalist during your hospital stay. If you have any questions about your discharge medications or the care you received while you were in the hospital after you are discharged, you can call the unit and asked to speak with the hospitalist on call if the hospitalist that took care of you is not available. Once you are discharged, your primary care physician will handle any further  medical issues. Please note that NO REFILLS for any discharge medications will be authorized once you are discharged, as it is imperative that you return to your primary care physician (or establish a relationship with a primary care physician if you do not have one) for your aftercare needs so that they can reassess your need for medications and monitor your lab values.      Discharge Instructions    Diet - low sodium heart healthy    Complete by:  As directed      Increase activity slowly    Complete by:  As directed             Medication List    TAKE these medications        amLODipine 5 MG tablet  Commonly known as:  NORVASC  TAKE 1 TABLET BY MOUTH DAILY     aspirin 81 MG tablet  Take 81 mg by mouth daily.     tamsulosin 0.4 MG Caps capsule  Commonly known as:  FLOMAX  Take 1 capsule (0.4 mg total) by mouth daily.       No Known Allergies    The results of significant diagnostics from this hospitalization (including imaging, microbiology, ancillary and laboratory) are listed below for reference.    Significant Diagnostic Studies: Dg Chest 2 View  09/05/2014   CLINICAL DATA:  69 year old male with a history of left-sided chest pain.  EXAM: CHEST - 2 VIEW  COMPARISON:  None.  FINDINGS: Cardiomediastinal silhouette projects within normal  limits in size and contour. No confluent airspace disease, pneumothorax, or pleural effusion.  No displaced fracture.  Unremarkable appearance of the upper abdomen.  IMPRESSION: No radiographic evidence of acute cardiopulmonary disease.  Signed,  Dulcy Fanny. Earleen Newport, DO  [facility]  [facility] Radiology   Electronically Signed   By: Corrie Mckusick D.O.   On: 09/05/2014 18:49   Ct Angio Chest Pe W/cm &/or Wo Cm  09/05/2014   CLINICAL DATA:  69 year old male with chest pain radiating to the neck and left arm and shoulder.  EXAM: CT ANGIOGRAPHY CHEST WITH CONTRAST  TECHNIQUE: Multidetector CT imaging of the  chest was performed using the standard protocol during bolus administration of intravenous contrast. Multiplanar CT image reconstructions and MIPs were obtained to evaluate the vascular anatomy.  CONTRAST:  132mL OMNIPAQUE IOHEXOL 350 MG/ML SOLN  COMPARISON:  Chest radiograph dated 09/05/2014  FINDINGS: Lungs, pleural spaces, and central airways: A 4 mm subpleural nodular appearing focus in the right middle lobe anteriorly (series 15 image 51) likely represents a vessel. Small focal lingular subpleural/ cardiophrenic angle density likely represents atelectatic changes. No other focal consolidation is noted. No pleural effusion or pneumothorax. The central airways are patent.  Vasculature: Unremarkable as visualized. No CT evidence of pulmonary embolism. No thoracic aortic aneurysm or dissection.  Heart: No cardiomegaly. No pericardial effusion. There is coronary vascular calcification.  Mediastinum: Unremarkable.  No mass.  Lymph nodes: No adenopathy .  Chest wall/ musculoskeletal: Degenerative changes of the spine. No acute fracture.  Upper abdomen: Diffuse hepatic steatosis.  Review of the MIP images confirms the above findings.  IMPRESSION: No acute intrathoracic pathology. No CT evidence of pulmonary embolism or aortic dissection.   Electronically Signed   By: Anner Crete M.D.   On: 09/05/2014 23:07    Microbiology: No results found for this or any previous visit (from the past 240 hour(s)).   Labs: Basic Metabolic Panel:  Recent Labs Lab 09/05/14 1831 09/05/14 2325  NA 136  --   K 3.5  --   CL 102  --   CO2 27  --   GLUCOSE 100*  --   BUN 16  --   CREATININE 0.94 0.93  CALCIUM 9.1  --    Liver Function Tests: No results for input(s): AST, ALT, ALKPHOS, BILITOT, PROT, ALBUMIN in the last 168 hours. No results for input(s): LIPASE, AMYLASE in the last 168 hours. No results for input(s): AMMONIA in the last 168 hours. CBC:  Recent Labs Lab 09/05/14 1831 09/05/14 2325  WBC 12.7*  11.9*  HGB 14.6 14.3  HCT 42.7 41.2  MCV 89.1 87.7  PLT 217 196   Cardiac Enzymes:  Recent Labs Lab 09/05/14 2325 09/06/14 0205 09/06/14 0436  TROPONINI <0.03 <0.03 <0.03   BNP: BNP (last 3 results) No results for input(s): BNP in the last 8760 hours.  ProBNP (last 3 results) No results for input(s): PROBNP in the last 8760 hours.  CBG: No results for input(s): GLUCAP in the last 168 hours.     SignedDebbe Odea, MD [facility] 09/09/2014, 4:40 PM

## 2014-09-06 NOTE — Telephone Encounter (Signed)
Pt was admitted to the hospital.  [facility]

## 2014-09-07 ENCOUNTER — Telehealth: Payer: Self-pay | Admitting: *Deleted

## 2014-09-07 NOTE — Telephone Encounter (Signed)
Transition Care Management Follow-up Telephone Call  How have you been since you were released from the hospital? wonderful   Do you understand why you were in the hospital? yes   Do you understand the discharge instrcutions? yes  Items Reviewed:  Medications reviewed: yes  Allergies reviewed: yes  Dietary changes reviewed: yes  Referrals reviewed: yes   Functional Questionnaire:   Activities of Daily Living (ADLs):   He states they are independent in the following: ambulation, bathing and hygiene, feeding, continence, grooming, toileting and dressing States they require assistance with the following: none   Any transportation issues/concerns?: no   Any patient concerns? No.  Patient thinks he may have just pulled a muscle to cause the chest pain.   Confirmed importance and date/time of follow-up visits scheduled: yes   Confirmed with patient if condition begins to worsen call PCP or go to the ER.  Patient was given the Call-a-Nurse line [phone]:

## 2014-09-14 ENCOUNTER — Encounter: Payer: Self-pay | Admitting: Internal Medicine

## 2014-09-14 ENCOUNTER — Ambulatory Visit (INDEPENDENT_AMBULATORY_CARE_PROVIDER_SITE_OTHER): Payer: Medicare Other | Admitting: Internal Medicine

## 2014-09-14 VITALS — BP 140/80 | HR 69 | Temp 98.0°F | Resp 20 | Ht 66.0 in | Wt 183.0 lb

## 2014-09-14 DIAGNOSIS — I10 Essential (primary) hypertension: Secondary | ICD-10-CM

## 2014-09-14 DIAGNOSIS — Z86010 Personal history of colonic polyps: Secondary | ICD-10-CM

## 2014-09-14 DIAGNOSIS — R079 Chest pain, unspecified: Secondary | ICD-10-CM

## 2014-09-14 NOTE — Progress Notes (Signed)
Pre visit review using our clinic review tool, if applicable. No additional management support is needed unless otherwise documented below in the visit note. 

## 2014-09-14 NOTE — Progress Notes (Signed)
Subjective:    Patient ID: Joel [name], male    DOB: 1945-07-07, 69 y.o.   MRN: [id number]  HPI Admit date: 09/05/2014 Discharge date: 09/09/2014   Discharge Condition: Stable Diet recommendation: Low sodium heart healthy  Discharge Diagnoses:  Principal Problem:  Chest pain Active Problems:  Essential hypertension  Hyperlipidemia  Folliculitis   History of present illness:  This is a 69 year old male with a history of hypertension who presented to the hospital with chest pain. Pain was in the center of his chest and sharp followed by radiation to his left shoulder along with numbness in the left arm. He had a CT scan of the chest in the ER which was negative for PE or any other acute etiology area he is a nonsmoker with no history of coronary artery disease.   69 year old patient who is seen following a recent hospital discharge.  He presented with left-sided chest pain that was aggravated by deep inspiration.  Chest CTA was negative for PE. He now recalls that prior to his hospital admission.  He may have strained his left chest wall lifting a heavy butane fuel take days prior to his onset of pain.  There has been no recurrent pain since his discharge.  He generally feels well He is scheduled for a urology follow-up soon and also a annual exam here next month   Hospital records reviewed   Past Medical History  Diagnosis Date  . BENIGN PROSTATIC HYPERTROPHY 05/07/2009  . COLONIC POLYPS, HX OF 05/07/2009  . HYPERLIPIDEMIA 05/07/2009  . HYPERTENSION 05/07/2009    History   Social History  . Marital Status: Married    Spouse Name: N/A  . Number of Children: N/A  . Years of Education: N/A   Occupational History  . Not on file.   Social History Main Topics  . Smoking status: Never Smoker   . Smokeless tobacco: Never Used  . Alcohol Use: Yes  . Drug Use: No  . Sexual Activity: Not on file   Other Topics Concern  . Not on file   Social History Narrative     Past Surgical History  Procedure Laterality Date  . Nasal sinus surgery    . Hand tendon surgery      Left thumb    Family History  Problem Relation Age of Onset  . Hyperlipidemia Mother   . Hypertension Mother   . Stroke Mother   . Hyperlipidemia Father   . Hypertension Father   . Stroke Father     No Known Allergies  Current Outpatient Prescriptions on File Prior to Visit  Medication Sig Dispense Refill  . amLODipine (NORVASC) 5 MG tablet TAKE 1 TABLET BY MOUTH DAILY 90 tablet 1  . aspirin 81 MG tablet Take 81 mg by mouth daily.      . tamsulosin (FLOMAX) 0.4 MG CAPS Take 1 capsule (0.4 mg total) by mouth daily. 90 capsule 6   No current facility-administered medications on file prior to visit.    BP 140/80 mmHg  Pulse 69  Temp(Src) 98 F (36.7 C) (Oral)  Resp 20  Ht 5\' 6"  (1.676 m)  Wt 183 lb (83.008 kg)  BMI 29.55 kg/m2  SpO2 98%     Review of Systems  Constitutional: Negative for fever, chills, appetite change and fatigue.  HENT: Negative for congestion, dental problem, ear pain, hearing loss, sore throat, tinnitus, trouble swallowing and voice change.   Eyes: Negative for pain, discharge and visual disturbance.  Respiratory: Negative  for cough, chest tightness, wheezing and stridor.   Cardiovascular: Positive for chest pain. Negative for palpitations and leg swelling.       Chest pain, resolved  Gastrointestinal: Negative for nausea, vomiting, abdominal pain, diarrhea, constipation, blood in stool and abdominal distention.  Genitourinary: Negative for urgency, hematuria, flank pain, discharge, difficulty urinating and genital sores.  Musculoskeletal: Negative for myalgias, back pain, joint swelling, arthralgias, gait problem and neck stiffness.  Skin: Negative for rash.  Neurological: Negative for dizziness, syncope, speech difficulty, weakness, numbness and headaches.  Hematological: Negative for adenopathy. Does not bruise/bleed easily.   Psychiatric/Behavioral: Negative for behavioral problems and dysphoric mood. The patient is not nervous/anxious.        Objective:   Physical Exam  Constitutional: He is oriented to person, place, and time. He appears well-developed.  HENT:  Head: Normocephalic.  Right Ear: External ear normal.  Left Ear: External ear normal.  Eyes: Conjunctivae and EOM are normal.  Neck: Normal range of motion.  Cardiovascular: Normal rate and normal heart sounds.   Pulmonary/Chest: Breath sounds normal. No respiratory distress. He has no wheezes. He has no rales. He exhibits no tenderness.  Abdominal: Bowel sounds are normal.  Musculoskeletal: Normal range of motion. He exhibits no edema or tenderness.  Neurological: He is alert and oriented to person, place, and time.  Psychiatric: He has a normal mood and affect. His behavior is normal.          Assessment & Plan:   Left-sided chest wall pain, resolved Essential hypertension, stable Dyslipidemia History of elevated PSA.  Follow-up urology as scheduled  Return in 6 months for his annual exam

## 2014-09-14 NOTE — Patient Instructions (Addendum)
Return in 6 months for follow-up  Report any new or worsening symptoms or recurrent chest pain

## 2014-10-05 ENCOUNTER — Other Ambulatory Visit: Payer: Self-pay | Admitting: Internal Medicine

## 2014-10-26 ENCOUNTER — Other Ambulatory Visit: Payer: Self-pay

## 2014-11-02 ENCOUNTER — Encounter: Payer: Self-pay | Admitting: Internal Medicine

## 2015-03-15 ENCOUNTER — Other Ambulatory Visit (INDEPENDENT_AMBULATORY_CARE_PROVIDER_SITE_OTHER): Payer: Medicare Other

## 2015-03-15 DIAGNOSIS — E785 Hyperlipidemia, unspecified: Secondary | ICD-10-CM

## 2015-03-15 DIAGNOSIS — Z0000 Encounter for general adult medical examination without abnormal findings: Secondary | ICD-10-CM

## 2015-03-15 LAB — CBC WITH DIFFERENTIAL/PLATELET
BASOS PCT: 1.2 % (ref 0.0–3.0)
Basophils Absolute: 0.1 10*3/uL (ref 0.0–0.1)
EOS ABS: 0.5 10*3/uL (ref 0.0–0.7)
Eosinophils Relative: 7.2 % — ABNORMAL HIGH (ref 0.0–5.0)
HEMATOCRIT: 47.4 % (ref 39.0–52.0)
Hemoglobin: 15.9 g/dL (ref 13.0–17.0)
Lymphocytes Relative: 31 % (ref 12.0–46.0)
Lymphs Abs: 2.4 10*3/uL (ref 0.7–4.0)
MCHC: 33.5 g/dL (ref 30.0–36.0)
MCV: 90.8 fl (ref 78.0–100.0)
Monocytes Absolute: 0.6 10*3/uL (ref 0.1–1.0)
Monocytes Relative: 8.3 % (ref 3.0–12.0)
NEUTROS ABS: 4 10*3/uL (ref 1.4–7.7)
NEUTROS PCT: 52.3 % (ref 43.0–77.0)
Platelets: 262 10*3/uL (ref 150.0–400.0)
RBC: 5.22 Mil/uL (ref 4.22–5.81)
RDW: 13.5 % (ref 11.5–15.5)
WBC: 7.7 10*3/uL (ref 4.0–10.5)

## 2015-03-15 LAB — POCT URINALYSIS DIPSTICK
Bilirubin, UA: NEGATIVE
Blood, UA: NEGATIVE
Glucose, UA: NEGATIVE
Ketones, UA: NEGATIVE
Leukocytes, UA: NEGATIVE
Nitrite, UA: NEGATIVE
PROTEIN UA: NEGATIVE
Spec Grav, UA: 1.015
UROBILINOGEN UA: 0.2
pH, UA: 7

## 2015-03-15 LAB — LIPID PANEL
CHOLESTEROL: 213 mg/dL — AB (ref 0–200)
HDL: 70.7 mg/dL (ref >39.00)
LDL Cholesterol: 129 mg/dL — ABNORMAL HIGH (ref 0–99)
NONHDL: 142.67
Total CHOL/HDL Ratio: 3
Triglycerides: 67 mg/dL (ref 0.0–149.0)
VLDL: 13.4 mg/dL (ref 0.0–40.0)

## 2015-03-15 LAB — HEPATIC FUNCTION PANEL
ALBUMIN: 4.3 g/dL (ref 3.5–5.2)
ALK PHOS: 62 U/L (ref 39–117)
ALT: 32 U/L (ref 0–53)
AST: 25 U/L (ref 0–37)
BILIRUBIN DIRECT: 0.1 mg/dL (ref 0.0–0.3)
TOTAL PROTEIN: 6.7 g/dL (ref 6.0–8.3)
Total Bilirubin: 0.7 mg/dL (ref 0.2–1.2)

## 2015-03-15 LAB — BASIC METABOLIC PANEL
BUN: 19 mg/dL (ref 6–23)
CHLORIDE: 104 meq/L (ref 96–112)
CO2: 30 meq/L (ref 19–32)
Calcium: 9.7 mg/dL (ref 8.4–10.5)
Creatinine, Ser: 1.12 mg/dL (ref 0.40–1.50)
GFR: 69.15 mL/min (ref >60.00)
GLUCOSE: 95 mg/dL (ref 70–99)
Potassium: 4.6 mEq/L (ref 3.5–5.1)
Sodium: 142 mEq/L (ref 135–145)

## 2015-03-15 LAB — PSA: PSA: 8.05 ng/mL — AB (ref 0.10–4.00)

## 2015-03-15 LAB — TSH: TSH: 1.67 u[IU]/mL (ref 0.35–4.50)

## 2015-03-20 ENCOUNTER — Encounter: Payer: Self-pay | Admitting: Internal Medicine

## 2015-03-20 ENCOUNTER — Ambulatory Visit (INDEPENDENT_AMBULATORY_CARE_PROVIDER_SITE_OTHER): Payer: Medicare Other | Admitting: Internal Medicine

## 2015-03-20 VITALS — BP 146/84 | HR 66 | Temp 98.1°F | Resp 20 | Ht 64.5 in | Wt 188.0 lb

## 2015-03-20 DIAGNOSIS — Z86010 Personal history of colonic polyps: Secondary | ICD-10-CM

## 2015-03-20 DIAGNOSIS — R972 Elevated prostate specific antigen [PSA]: Secondary | ICD-10-CM

## 2015-03-20 DIAGNOSIS — Z0000 Encounter for general adult medical examination without abnormal findings: Secondary | ICD-10-CM

## 2015-03-20 DIAGNOSIS — Z23 Encounter for immunization: Secondary | ICD-10-CM

## 2015-03-20 DIAGNOSIS — I10 Essential (primary) hypertension: Secondary | ICD-10-CM

## 2015-03-20 DIAGNOSIS — E785 Hyperlipidemia, unspecified: Secondary | ICD-10-CM

## 2015-03-20 NOTE — Progress Notes (Signed)
Patient ID: Joel [name], male   DOB: 1945-09-03, 70 y.o.   MRN: [id number]  Subjective:    Patient ID: Joel [name], male    DOB: [birth year], 70 y.o.   MRN: [id number]  HPI 18  -year-old patient who is seen today for a preventive health examination.   He is followed by urology and did have a followup colonoscopy in in October of 2013 He has treated hypertension history colonic polyps BPH and elevated PSA. He is doing quite well. Patient was hospitalized July 2016 for evaluation of chest pain.    Family history- a sister has had an unprovoked pulmonary embolism   Preventive Screening-Counseling & Management  Alcohol-Tobacco  Smoking Status: never  Caffeine-Diet-Exercise  Does Patient Exercise: yes   Allergies (verified):  No Known Drug Allergies   Past History:  Past Medical History:  Colonic polyps, hx of  Hyperlipidemia  Hypertension  Benign prostatic hypertrophy  elevated PSA   Past Surgical History:  prostate biopsy, September 2010  severed FPL tendon (L thumb) 2002  Sinus surgery 2004  Colonoscopy. 2008  2013   Family History:   father died age 72, history of hypertension, dyslipidemia, cerebral aneurysm  mother died age 39 from a ruptured abdominal aortic aneurysm history of dyslipidemia and hypertension  Three sisters are well ; one with history of unprovoked pulmonary embolism maternal grandmother pancreatic cancer   Social History:   Occupation: Armed forces operational officer  Married  Never Smoked  Regular exercise-yes  Smoking Status: never  Does Patient Exercise: yes  1. Risk factors, based on past  M,S,F history- cardiovascular risk factors include hypertension only  2.  Physical activities: Fairly active without restrictions does walk one or 2 miles 3 times weekly. Bikes occasionally  3.  Depression/mood: History of anxiety disorder which has resolved;  has been on sertraline in the past  4.  Hearing: No deficits  5.  ADL's: Independent  6.  Fall risk:  Low  7.  Home safety: No problems identified  8.  Height weight, and visual acuity; height and weight stable no change in visual acuity  9.  Counseling: Heart healthy diet regular exercise modest weight loss all encouraged  10. Lab orders based on risk factors: Laboratory profile reviewed  11. Referral : Followup urology  12. Care plan: Continue present regimen and home blood pressure monitoring encouraged  13. Cognitive assessment: Alert and oriented with normal affect. No cognitive dysfunction  14.  Preventive services will include annual exams and followup colonoscopy  15.  Provider list updated-followup urology next month as scheduled; GI followup for a followup colonoscopy in 3 years  Past Medical History  Diagnosis Date  . BENIGN PROSTATIC HYPERTROPHY 05/07/2009  . COLONIC POLYPS, HX OF 05/07/2009  . HYPERLIPIDEMIA 05/07/2009  . HYPERTENSION 05/07/2009    Social History   Social History  . Marital Status: Married    Spouse Name: N/A  . Number of Children: N/A  . Years of Education: N/A   Occupational History  . Not on file.   Social History Main Topics  . Smoking status: Never Smoker   . Smokeless tobacco: Never Used  . Alcohol Use: Yes  . Drug Use: No  . Sexual Activity: Not on file   Other Topics Concern  . Not on file   Social History Narrative    Past Surgical History  Procedure Laterality Date  . Nasal sinus surgery    . Hand tendon surgery      Left thumb  Family History  Problem Relation Age of Onset  . Hyperlipidemia Mother   . Hypertension Mother   . Stroke Mother   . Hyperlipidemia Father   . Hypertension Father   . Stroke Father     No Known Allergies  Current Outpatient Prescriptions on File Prior to Visit  Medication Sig Dispense Refill  . amLODipine (NORVASC) 5 MG tablet TAKE 1 TABLET BY MOUTH DAILY 90 tablet 1  . aspirin 81 MG tablet Take 81 mg by mouth daily.      . tamsulosin (FLOMAX) 0.4 MG CAPS Take 1 capsule (0.4 mg  total) by mouth daily. 90 capsule 6   No current facility-administered medications on file prior to visit.    There were no vitals taken for this visit.         Review of Systems  Constitutional: Negative for fever, chills, activity change, appetite change and fatigue.  HENT: Negative for congestion, dental problem, ear pain, hearing loss, mouth sores, rhinorrhea, sinus pressure, sneezing, tinnitus, trouble swallowing and voice change.   Eyes: Negative for photophobia, pain, redness and visual disturbance.  Respiratory: Negative for apnea, cough, choking, chest tightness, shortness of breath and wheezing.   Cardiovascular: Negative for chest pain, palpitations and leg swelling.  Gastrointestinal: Positive for diarrhea (intermittent). Negative for nausea, vomiting, abdominal pain, constipation, blood in stool, abdominal distention, anal bleeding ( anal itching) and rectal pain.  Genitourinary: Negative for dysuria, urgency, frequency, hematuria, flank pain, decreased urine volume, discharge, penile swelling, scrotal swelling, difficulty urinating, genital sores and testicular pain.  Musculoskeletal: Negative for myalgias, back pain, joint swelling, arthralgias, gait problem, neck pain and neck stiffness.  Skin: Negative for color change, rash and wound.  Neurological: Negative for dizziness, tremors, seizures, syncope, facial asymmetry, speech difficulty, weakness, light-headedness, numbness and headaches.  Hematological: Negative for adenopathy. Does not bruise/bleed easily.  Psychiatric/Behavioral: Negative for suicidal ideas, hallucinations, behavioral problems, confusion, sleep disturbance, self-injury, dysphoric mood, decreased concentration and agitation. The patient is not nervous/anxious.        Objective:   Physical Exam  Constitutional: He appears well-developed and well-nourished.  Weight 183  HENT:  Head: Normocephalic and atraumatic.  Right Ear: External ear normal.   Left Ear: External ear normal.  Nose: Nose normal.  Mouth/Throat: Oropharynx is clear and moist.  Eyes: Conjunctivae and EOM are normal. Pupils are equal, round, and reactive to light. No scleral icterus.  Neck: Normal range of motion. Neck supple. No JVD present. No thyromegaly present.  Cardiovascular: Regular rhythm, normal heart sounds and intact distal pulses.  Exam reveals no gallop and no friction rub.   No murmur heard. Pulmonary/Chest: Effort normal and breath sounds normal. He exhibits no tenderness.  Abdominal: Soft. Bowel sounds are normal. He exhibits no distension and no mass. There is no tenderness.  Genitourinary: Penis normal.  Musculoskeletal: Normal range of motion. He exhibits no edema or tenderness.  Lymphadenopathy:    He has no cervical adenopathy.  Neurological: He is alert. He has normal reflexes. No cranial nerve deficit. Coordination normal.  Skin: Skin is warm and dry. No rash noted.  Psychiatric: He has a normal mood and affect. His behavior is normal.          Assessment & Plan:  Preventive health exam Hypertension well controlled BPH with elevated PSA. Followup neurology Colonic Polyps.    Will set her for one-time screening AAA ultrasound

## 2015-03-20 NOTE — Progress Notes (Signed)
Pre visit review using our clinic review tool, if applicable. No additional management support is needed unless otherwise documented below in the visit note. 

## 2015-03-20 NOTE — Patient Instructions (Addendum)
Limit your sodium (Salt) intake  Please check your blood pressure on a regular basis.  If it is consistently greater than 150/90, please make an office appointment.    It is important that you exercise regularly, at least 20 minutes 3 to 4 times per week.  If you develop chest pain or shortness of breath seek  medical attention.   Screening for abdominal aortic aneurysm  Return in one year for follow-up Health Maintenance, Male A healthy lifestyle and preventative care can promote health and wellness.  Maintain regular health, dental, and eye exams.  Eat a healthy diet. Foods like vegetables, fruits, whole grains, low-fat dairy products, and lean protein foods contain the nutrients you need and are low in calories. Decrease your intake of foods high in solid fats, added sugars, and salt. Get information about a proper diet from your health care provider, if necessary.  Regular physical exercise is one of the most important things you can do for your health. Most adults should get at least 150 minutes of moderate-intensity exercise (any activity that increases your heart rate and causes you to sweat) each week. In addition, most adults need muscle-strengthening exercises on 2 or more days a week.   Maintain a healthy weight. The body mass index (BMI) is a screening tool to identify possible weight problems. It provides an estimate of body fat based on height and weight. Your health care provider can find your BMI and can help you achieve or maintain a healthy weight. For males 20 years and older:  A BMI below 18.5 is considered underweight.  A BMI of 18.5 to 24.9 is normal.  A BMI of 25 to 29.9 is considered overweight.  A BMI of 30 and above is considered obese.  Maintain normal blood lipids and cholesterol by exercising and minimizing your intake of saturated fat. Eat a balanced diet with plenty of fruits and vegetables. Blood tests for lipids and cholesterol should begin at age 69 and  be repeated every 5 years. If your lipid or cholesterol levels are high, you are over age 34, or you are at high risk for heart disease, you may need your cholesterol levels checked more frequently.Ongoing high lipid and cholesterol levels should be treated with medicines if diet and exercise are not working.  If you smoke, find out from your health care provider how to quit. If you do not use tobacco, do not start.  Lung cancer screening is recommended for adults aged 34-80 years who are at high risk for developing lung cancer because of a history of smoking. A yearly low-dose CT scan of the lungs is recommended for people who have at least a 30-pack-year history of smoking and are current smokers or have quit within the past 15 years. A pack year of smoking is smoking an average of 1 pack of cigarettes a day for 1 year (for example, a 30-pack-year history of smoking could mean smoking 1 pack a day for 30 years or 2 packs a day for 15 years). Yearly screening should continue until the smoker has stopped smoking for at least 15 years. Yearly screening should be stopped for people who develop a health problem that would prevent them from having lung cancer treatment.  If you choose to drink alcohol, do not have more than 2 drinks per day. One drink is considered to be 12 oz (360 mL) of beer, 5 oz (150 mL) of wine, or 1.5 oz (45 mL) of liquor.  Avoid the use  of street drugs. Do not share needles with anyone. Ask for help if you need support or instructions about stopping the use of drugs.  High blood pressure causes heart disease and increases the risk of stroke. High blood pressure is more likely to develop in:  People who have blood pressure in the end of the normal range (100-139/85-89 mm Hg).  People who are overweight or obese.  People who are African American.  If you are 32-70 years of age, have your blood pressure checked every 3-5 years. If you are 66 years of age or older, have your blood  pressure checked every year. You should have your blood pressure measured twice--once when you are at a hospital or clinic, and once when you are not at a hospital or clinic. Record the average of the two measurements. To check your blood pressure when you are not at a hospital or clinic, you can use:  An automated blood pressure machine at a pharmacy.  A home blood pressure monitor.  If you are 66-65 years old, ask your health care provider if you should take aspirin to prevent heart disease.  Diabetes screening involves taking a blood sample to check your fasting blood sugar level. This should be done once every 3 years after age 18 if you are at a normal weight and without risk factors for diabetes. Testing should be considered at a younger age or be carried out more frequently if you are overweight and have at least 1 risk factor for diabetes.  Colorectal cancer can be detected and often prevented. Most routine colorectal cancer screening begins at the age of 73 and continues through age 61. However, your health care provider may recommend screening at an earlier age if you have risk factors for colon cancer. On a yearly basis, your health care provider may provide home test kits to check for hidden blood in the stool. A small camera at the end of a tube may be used to directly examine the colon (sigmoidoscopy or colonoscopy) to detect the earliest forms of colorectal cancer. Talk to your health care provider about this at age 81 when routine screening begins. A direct exam of the colon should be repeated every 5-10 years through age 46, unless early forms of precancerous polyps or small growths are found.  People who are at an increased risk for hepatitis B should be screened for this virus. You are considered at high risk for hepatitis B if:  You were born in a country where hepatitis B occurs often. Talk with your health care provider about which countries are considered high risk.  Your  parents were born in a high-risk country and you have not received a shot to protect against hepatitis B (hepatitis B vaccine).  You have HIV or AIDS.  You use needles to inject street drugs.  You live with, or have sex with, someone who has hepatitis B.  You are a man who has sex with other men (MSM).  You get hemodialysis treatment.  You take certain medicines for conditions like cancer, organ transplantation, and autoimmune conditions.  Hepatitis C blood testing is recommended for all people born from [birth year] through 1964 and any individual with known risk factors for hepatitis C.  Healthy men should no longer receive prostate-specific antigen (PSA) blood tests as part of routine cancer screening. Talk to your health care provider about prostate cancer screening.  Testicular cancer screening is not recommended for adolescents or adult males who have no symptoms.  Screening includes self-exam, a health care provider exam, and other screening tests. Consult with your health care provider about any symptoms you have or any concerns you have about testicular cancer.  Practice safe sex. Use condoms and avoid high-risk sexual practices to reduce the spread of sexually transmitted infections (STIs).  You should be screened for STIs, including gonorrhea and chlamydia if:  You are sexually active and are younger than 24 years.  You are older than 24 years, and your health care provider tells you that you are at risk for this type of infection.  Your sexual activity has changed since you were last screened, and you are at an increased risk for chlamydia or gonorrhea. Ask your health care provider if you are at risk.  If you are at risk of being infected with HIV, it is recommended that you take a prescription medicine daily to prevent HIV infection. This is called pre-exposure prophylaxis (PrEP). You are considered at risk if:  You are a man who has sex with other men (MSM).  You are a  heterosexual man who is sexually active with multiple partners.  You take drugs by injection.  You are sexually active with a partner who has HIV.  Talk with your health care provider about whether you are at high risk of being infected with HIV. If you choose to begin PrEP, you should first be tested for HIV. You should then be tested every 3 months for as long as you are taking PrEP.  Use sunscreen. Apply sunscreen liberally and repeatedly throughout the day. You should seek shade when your shadow is shorter than you. Protect yourself by wearing long sleeves, pants, a wide-brimmed hat, and sunglasses year round whenever you are outdoors.  Tell your health care provider of new moles or changes in moles, especially if there is a change in shape or color. Also, tell your health care provider if a mole is larger than the size of a pencil eraser.  A one-time screening for abdominal aortic aneurysm (AAA) and surgical repair of large AAAs by ultrasound is recommended for men aged 24-75 years who are current or former smokers.  Stay current with your vaccines (immunizations).   This information is not intended to replace advice given to you by your health care provider. Make sure you discuss any questions you have with your health care provider.   Document Released: 08/23/2007 Document Revised: 03/17/2014 Document Reviewed: 07/22/2010 Elsevier Interactive Patient Education [employer].

## 2015-03-22 ENCOUNTER — Encounter: Payer: Self-pay | Admitting: Internal Medicine

## 2015-03-23 ENCOUNTER — Telehealth: Payer: Self-pay | Admitting: Internal Medicine

## 2015-03-23 MED ORDER — AMLODIPINE BESYLATE 5 MG PO TABS: 5.0000 mg | ORAL_TABLET | Freq: Every day | ORAL | Status: DC

## 2015-03-23 NOTE — Telephone Encounter (Signed)
Spoke to pt, said he set everything up at his new pharmacy and I can refill blood pressure medication. Told pt okay I will send refills over right now. Pt verbalized understanding. Rx sent to pharmacy.

## 2015-03-23 NOTE — Telephone Encounter (Signed)
Pt states he is returning call to Butch Penny as a follow up to previous conversation regarding medication refill and change in pharmacy.  Please return call to patient.

## 2015-03-27 ENCOUNTER — Ambulatory Visit (HOSPITAL_COMMUNITY)
Admission: RE | Admit: 2015-03-27 | Discharge: 2015-03-27 | Disposition: A | Discharge status: Home / Self Care | Payer: Medicare Other | Source: Ambulatory Visit | Attending: Cardiovascular Disease | Admitting: Cardiovascular Disease

## 2015-03-27 DIAGNOSIS — I10 Essential (primary) hypertension: Secondary | ICD-10-CM | POA: Insufficient documentation

## 2015-03-27 DIAGNOSIS — Z0000 Encounter for general adult medical examination without abnormal findings: Secondary | ICD-10-CM | POA: Insufficient documentation

## 2015-03-27 DIAGNOSIS — Z136 Encounter for screening for cardiovascular disorders: Secondary | ICD-10-CM | POA: Insufficient documentation

## 2015-03-27 DIAGNOSIS — E785 Hyperlipidemia, unspecified: Secondary | ICD-10-CM | POA: Insufficient documentation

## 2015-04-03 ENCOUNTER — Telehealth: Payer: Self-pay | Admitting: Internal Medicine

## 2015-04-03 NOTE — Telephone Encounter (Signed)
Pt said he has tried the home remedies for his ear issues. He call today to say he still has the issue and is asking if he need to come in or if he need to see a ear doctor

## 2015-04-03 NOTE — Telephone Encounter (Signed)
Please see message and advise 

## 2015-04-03 NOTE — Telephone Encounter (Signed)
Called and discussed.  Will continue to observe for probable serous otitis media.  Will call back in a week or 2 if unimproved for ENT referral

## 2015-06-28 ENCOUNTER — Ambulatory Visit (INDEPENDENT_AMBULATORY_CARE_PROVIDER_SITE_OTHER): Payer: Medicare Other | Admitting: Podiatry

## 2015-06-28 ENCOUNTER — Encounter: Payer: Self-pay | Admitting: Podiatry

## 2015-06-28 VITALS — BP 139/75 | HR 58 | Resp 14

## 2015-06-28 DIAGNOSIS — M722 Plantar fascial fibromatosis: Secondary | ICD-10-CM | POA: Diagnosis not present

## 2015-06-28 MED ORDER — MELOXICAM 15 MG PO TABS: 15.0000 mg | ORAL_TABLET | Freq: Every day | ORAL | Status: DC

## 2015-06-28 NOTE — Progress Notes (Signed)
   Subjective:    Patient ID: LONZIE TORAN, male    DOB: [birth year], 70 y.o.   MRN: [id number]  HPI this patient presents to the office stating that he has heel pain for the last 2 months in his left foot. He says he has iced the foot and perform stretches, but the problem is worsening. He presents the office stating his pain upon rising in the morning and standing from a sitting position. He has purchased insoles, which have not helped reduce his pain. He says he experiences pain 8 out of 10 at its worst. He presents the office today for an evaluation and treatment of this condition    Review of Systems  All other systems reviewed and are negative.      Objective:   Physical Exam GENERAL APPEARANCE: Alert, conversant. Appropriately groomed. No acute distress.  VASCULAR: Pedal pulses are  palpable at  [facility] [address] and PT bilateral.  Capillary refill time is immediate to all digits,  Normal temperature gradient.  Digital hair growth is present bilateral  NEUROLOGIC: sensation is diminished  to 5.07 monofilament at 5/5 sites bilateral.  Light touch is intact bilateral, Muscle strength normal.  MUSCULOSKELETAL: acceptable muscle strength, tone and stability bilateral.  Intrinsic muscluature intact bilateral.  Rectus appearance of foot and digits noted bilateral. Palpable pain at the insertion plantar fascia left foot.  DERMATOLOGIC: skin color, texture, and turgor are within normal limits.  No preulcerative lesions or ulcers  are seen, no interdigital maceration noted.  No open lesions present.  Digital nails are asymptomatic. No drainage noted.        Assessment & Plan:  Plantar fascitis left foot  IE  Injection therapy.  Prescribe Mobic.  Continue stretching and icing.  Recommend powerstep insoles.RTC 2 weeks.  Gardiner Barefoot DPM

## 2015-07-12 ENCOUNTER — Encounter: Payer: Self-pay | Admitting: Podiatry

## 2015-07-12 ENCOUNTER — Ambulatory Visit (INDEPENDENT_AMBULATORY_CARE_PROVIDER_SITE_OTHER): Payer: Medicare Other | Admitting: Podiatry

## 2015-07-12 DIAGNOSIS — M722 Plantar fascial fibromatosis: Secondary | ICD-10-CM

## 2015-07-12 NOTE — Progress Notes (Signed)
   Subjective:    Patient ID: Joel [name], male    DOB: September 15, 1945, 70 y.o.   MRN: [id number]  HPI this patient presents to the office stating that his heel is 85% improved.  There is occasional pain when he works hours in his yard.  He says the injection and purestrides have helped.  He admits not taking the Mobic since he believed they were for pain.  He presents the office today for an evaluation and treatment of this condition    Review of Systems  All other systems reviewed and are negative.      Objective:   Physical Exam GENERAL APPEARANCE: Alert, conversant. Appropriately groomed. No acute distress.  VASCULAR: Pedal pulses are  palpable at  [facility] and PT bilateral.  Capillary refill time is immediate to all digits,  Normal temperature gradient.  Digital hair growth is present bilateral  NEUROLOGIC: sensation is diminished  to 5.07 monofilament at 5/5 sites bilateral.  Light touch is intact bilateral, Muscle strength normal.  MUSCULOSKELETAL: acceptable muscle strength, tone and stability bilateral.  Intrinsic muscluature intact bilateral.  Rectus appearance of foot and digits noted bilateral. Palpable pain from last visit  at the insertion plantar fascia left foot has resolved.  DERMATOLOGIC: skin color, texture, and turgor are within normal limits.  No preulcerative lesions or ulcers  are seen, no interdigital maceration noted.  No open lesions present.  Digital nails are asymptomatic. No drainage noted.        Assessment & Plan:  Plantar fascitis left foot ROV  Continue with purestrides.  Take Mobic as needed.  RTC prn.   Gardiner Barefoot DPM  Gardiner Barefoot DPM

## 2016-03-17 ENCOUNTER — Other Ambulatory Visit (INDEPENDENT_AMBULATORY_CARE_PROVIDER_SITE_OTHER): Payer: Medicare HMO

## 2016-03-17 DIAGNOSIS — I10 Essential (primary) hypertension: Secondary | ICD-10-CM

## 2016-03-17 DIAGNOSIS — E785 Hyperlipidemia, unspecified: Secondary | ICD-10-CM

## 2016-03-17 DIAGNOSIS — R972 Elevated prostate specific antigen [PSA]: Secondary | ICD-10-CM | POA: Diagnosis not present

## 2016-03-17 LAB — CBC WITH DIFFERENTIAL/PLATELET
Basophils Absolute: 0 10*3/uL (ref 0.0–0.1)
Basophils Relative: 0.5 % (ref 0.0–3.0)
EOS ABS: 0.4 10*3/uL (ref 0.0–0.7)
Eosinophils Relative: 5.2 % — ABNORMAL HIGH (ref 0.0–5.0)
HCT: 45.1 % (ref 39.0–52.0)
HEMOGLOBIN: 15.5 g/dL (ref 13.0–17.0)
Lymphocytes Relative: 30.4 % (ref 12.0–46.0)
Lymphs Abs: 2.3 10*3/uL (ref 0.7–4.0)
MCHC: 34.4 g/dL (ref 30.0–36.0)
MCV: 89.7 fl (ref 78.0–100.0)
Monocytes Absolute: 0.6 10*3/uL (ref 0.1–1.0)
Monocytes Relative: 7.3 % (ref 3.0–12.0)
Neutro Abs: 4.4 10*3/uL (ref 1.4–7.7)
Neutrophils Relative %: 56.6 % (ref 43.0–77.0)
Platelets: 223 10*3/uL (ref 150.0–400.0)
RBC: 5.03 Mil/uL (ref 4.22–5.81)
RDW: 13.5 % (ref 11.5–15.5)
WBC: 7.7 10*3/uL (ref 4.0–10.5)

## 2016-03-17 LAB — POC URINALSYSI DIPSTICK (AUTOMATED)
Bilirubin, UA: NEGATIVE
Blood, UA: NEGATIVE
Glucose, UA: NEGATIVE
KETONES UA: NEGATIVE
Leukocytes, UA: NEGATIVE
Nitrite, UA: NEGATIVE
Protein, UA: NEGATIVE
SPEC GRAV UA: 1.015
Urobilinogen, UA: 0.2
pH, UA: 6

## 2016-03-17 LAB — HEPATIC FUNCTION PANEL
ALK PHOS: 59 U/L (ref 39–117)
ALT: 26 U/L (ref 0–53)
AST: 19 U/L (ref 0–37)
Albumin: 4.2 g/dL (ref 3.5–5.2)
BILIRUBIN DIRECT: 0.1 mg/dL (ref 0.0–0.3)
BILIRUBIN TOTAL: 0.7 mg/dL (ref 0.2–1.2)
Total Protein: 6.7 g/dL (ref 6.0–8.3)

## 2016-03-17 LAB — LIPID PANEL
CHOL/HDL RATIO: 3
Cholesterol: 210 mg/dL — ABNORMAL HIGH (ref 0–200)
HDL: 73.5 mg/dL (ref >39.00)
LDL CALC: 118 mg/dL — AB (ref 0–99)
NonHDL: 136.28
TRIGLYCERIDES: 89 mg/dL (ref 0.0–149.0)
VLDL: 17.8 mg/dL (ref 0.0–40.0)

## 2016-03-17 LAB — BASIC METABOLIC PANEL
BUN: 18 mg/dL (ref 6–23)
CO2: 29 mEq/L (ref 19–32)
CREATININE: 1.08 mg/dL (ref 0.40–1.50)
Calcium: 9.5 mg/dL (ref 8.4–10.5)
Chloride: 105 mEq/L (ref 96–112)
GFR: 71.9 mL/min (ref >60.00)
Glucose, Bld: 90 mg/dL (ref 70–99)
Potassium: 4.5 mEq/L (ref 3.5–5.1)
Sodium: 142 mEq/L (ref 135–145)

## 2016-03-17 LAB — PSA: PSA: 8.95 ng/mL — ABNORMAL HIGH (ref 0.10–4.00)

## 2016-03-17 LAB — TSH: TSH: 1.43 u[IU]/mL (ref 0.35–4.50)

## 2016-03-24 ENCOUNTER — Ambulatory Visit (INDEPENDENT_AMBULATORY_CARE_PROVIDER_SITE_OTHER): Payer: Medicare HMO | Admitting: Internal Medicine

## 2016-03-24 ENCOUNTER — Encounter: Payer: Self-pay | Admitting: Internal Medicine

## 2016-03-24 VITALS — BP 154/82 | HR 62 | Temp 97.7°F | Ht 65.5 in | Wt 189.8 lb

## 2016-03-24 DIAGNOSIS — Z0000 Encounter for general adult medical examination without abnormal findings: Secondary | ICD-10-CM | POA: Diagnosis not present

## 2016-03-24 MED ORDER — TAMSULOSIN HCL 0.4 MG PO CAPS: 0.4000 mg | ORAL_CAPSULE | Freq: Every day | ORAL | 6 refills | Status: DC

## 2016-03-24 MED ORDER — AMLODIPINE BESYLATE 5 MG PO TABS: 5.0000 mg | ORAL_TABLET | Freq: Every day | ORAL | 3 refills | Status: DC

## 2016-03-24 NOTE — Patient Instructions (Signed)
Limit your sodium (Salt) intake  Please check your blood pressure on a regular basis.  If it is consistently greater than 140/90, please make an office appointment.    It is important that you exercise regularly, at least 20 minutes 3 to 4 times per week.  If you develop chest pain or shortness of breath seek  medical attention.  Annual urology follow-up

## 2016-03-24 NOTE — Progress Notes (Signed)
Pre visit review using our clinic review tool, if applicable. No additional management support is needed unless otherwise documented below in the visit note. 

## 2016-03-24 NOTE — Progress Notes (Signed)
Subjective:    Patient ID: Joel [name], male    DOB: 10/21/1945, 71 y.o.   MRN: [id number]  HPI  71 year old patient who is seen today for a preventive health examination.  No concerns or complaints.  He is recently retired and is participating more at his health club..  Last colonoscopy about 4 years ago Home blood pressure readings systolic readings between 1:30 and 140  Preventive Screening-Counseling & Management  Alcohol-Tobacco  Smoking Status: never  Caffeine-Diet-Exercise  Does Patient Exercise: yes   Allergies (verified):  No Known Drug Allergies   Past History:  Past Medical History:  Colonic polyps, hx of  Hyperlipidemia  Hypertension  Benign prostatic hypertrophy  elevated PSA   Past Surgical History:  prostate biopsy, September 2010  severed FPL tendon (L thumb) 2002  Sinus surgery 2004  Colonoscopy. 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 BENIGN PROSTATIC HYPERTROPHY 05/07/2009  . COLONIC POLYPS, HX OF 05/07/2009  . HYPERLIPIDEMIA 05/07/2009  . HYPERTENSION 05/07/2009     Social History   Social History  . Marital status: Married    Spouse name: N/A  . Number of children: N/A  . Years of education: N/A   Occupational History  . Not on file.   Social History Main Topics  . Smoking status: Never Smoker  . Smokeless tobacco: Never Used  . Alcohol use Yes  . Drug use: No  . Sexual activity: Not on file   Other Topics Concern  . Not on file    Social History Narrative  . No narrative on file    Past Surgical History:  Procedure Laterality Date  . HAND TENDON SURGERY     Left thumb  . NASAL SINUS SURGERY    . NM RENAL LASIX ([facility] HX)      Family History  Problem Relation Age of Onset  . Hyperlipidemia Mother   . Hypertension Mother   . Stroke Mother   . Hyperlipidemia Father   . Hypertension Father   . Stroke Father     No Known Allergies  Current Outpatient Prescriptions on File Prior to Visit  Medication Sig Dispense Refill  . amLODipine (NORVASC) 5 MG tablet Take 1 tablet (5 mg total) by mouth daily. 90 tablet 3  . aspirin 81 MG tablet Take 81 mg by mouth daily.      . meloxicam (MOBIC) 15 MG tablet Take 1 tablet (15 mg total) by mouth daily. 30 tablet 0  . tamsulosin (FLOMAX) 0.4 MG CAPS Take 1 capsule (0.4 mg total) by mouth daily. 90 capsule 6   No current facility-administered medications on file prior to visit.    Medicare wellness exam  1. Risk factors, based on past  M,S,F history.  Cardiac-risk factors include a history of hypertension  2.  Physical activities:remains active.  There are activities health club as well as walking  3.  Depression/mood:no history of major depression or mood disorder  4.  Hearing:mild deficits in a crowded noisy environment  5.  ADL's:independent 6.  Fall risk:low  7.  Home safety:no problems identified  8.  Height weight, and visual acuity;height weight stable no change in visual acuity  9.  Counseling:continue heart healthy diet in rigorous activity  10. Lab orders based on risk factors:laboratory studies reviewed  11. Referral :continue annual urology follow-up  12. Care plan:continue efforts at aggressive risk factor modification.  Continue home blood pressure monitoring.  Patient report.  Systolic blood pressures consistently greater than 1:30  13. Cognitive assessment: alert and oriented with normal affect no cognitive dysfunction  14. Screening:  Patient provided with a written and personalized 5-10 year screening schedule in the AVS.    15. Provider List Update: primary care urology   BP (!) 154/82 (BP Location: Right Arm, Patient Position: Sitting, Cuff Size: Normal)   Pulse 62   Temp 97.7 F (36.5 C) (Oral)   Ht 5' 5.5" (1.664 m)   Wt 189 lb 12.8 oz (86.1 kg)   SpO2 97%   BMI 31.10 kg/m    Review of Systems  Constitutional: Negative for appetite change, chills, fatigue and fever.  HENT: Negative for congestion, dental problem, ear pain, hearing loss, sore throat, tinnitus, trouble swallowing and voice change.   Eyes: Negative for pain, discharge and visual disturbance.  Respiratory: Negative for cough, chest tightness, wheezing and stridor.   Cardiovascular: Negative for chest pain, palpitations and leg swelling.  Gastrointestinal: Negative for abdominal distention, abdominal pain, blood in stool, constipation, diarrhea, nausea and vomiting.  Genitourinary: Negative for difficulty urinating, discharge, flank pain, genital sores, hematuria and urgency.  Musculoskeletal: Negative for arthralgias, back pain, gait problem, joint swelling, myalgias and neck stiffness.  Skin: Negative for rash.  Neurological: Negative for dizziness, syncope, speech difficulty, weakness, numbness and headaches.  Hematological: Negative for adenopathy. Does not bruise/bleed easily.  Psychiatric/Behavioral: Negative for behavioral problems and dysphoric mood. The patient is not nervous/anxious.        Objective:   Physical Exam  Constitutional: He appears well-developed and well-nourished.  Blood pressure 140/82  HENT:  Head: Normocephalic and atraumatic.  Right Ear: External ear normal.  Left Ear: External ear normal.  Nose: Nose normal.  Mouth/Throat: Oropharynx is clear and moist.  Pharyngeal crowding  Eyes: Conjunctivae and EOM are normal. Pupils are equal, round, and reactive to light. No scleral icterus.  Neck: Normal range of  motion. Neck supple. No JVD present. No thyromegaly present.  Cardiovascular: Regular rhythm, normal heart sounds and intact distal pulses.  Exam reveals no gallop and no friction rub.   No murmur heard. Pulmonary/Chest: Effort normal and breath sounds normal. He exhibits no tenderness.  Abdominal: Soft. Bowel sounds are normal. He exhibits no distension and no mass. There is no tenderness.  Genitourinary: Penis normal.  Musculoskeletal: Normal range of motion. He exhibits no edema or tenderness.  Lymphadenopathy:    He has no cervical adenopathy.  Neurological: He is alert. He has normal reflexes. No cranial nerve deficit. Coordination normal.  Skin: Skin is warm and dry. No rash noted.  Psychiatric: He has a normal mood and affect. His behavior is normal.          Assessment & Plan:   Preventive health exam Medicare wellness visit Essential hypertension.  Patient will continue home blood pressure monitoring.  Will consider additional treatment if systolic blood pressures are consistently greater than 1:30-140  BPH/history of elevated PSA.  Follow-up urology  Nyoka Cowden

## 2016-04-17 ENCOUNTER — Ambulatory Visit (INDEPENDENT_AMBULATORY_CARE_PROVIDER_SITE_OTHER): Payer: Medicare HMO | Admitting: Internal Medicine

## 2016-04-17 ENCOUNTER — Encounter: Payer: Self-pay | Admitting: Internal Medicine

## 2016-04-17 VITALS — BP 160/82 | HR 68 | Temp 97.6°F | Ht 65.5 in | Wt 195.2 lb

## 2016-04-17 DIAGNOSIS — M542 Cervicalgia: Secondary | ICD-10-CM | POA: Diagnosis not present

## 2016-04-17 DIAGNOSIS — H8110 Benign paroxysmal vertigo, unspecified ear: Secondary | ICD-10-CM | POA: Diagnosis not present

## 2016-04-17 NOTE — Patient Instructions (Addendum)
Benign Positional Vertigo Introduction Vertigo is the feeling that you or your surroundings are moving when they are not. Benign positional vertigo is the most common form of vertigo. The cause of this condition is not serious (is benign). This condition is triggered by certain movements and positions (is positional). This condition can be dangerous if it occurs while you are doing something that could endanger you or others, such as driving. What are the causes? In many cases, the cause of this condition is not known. It may be caused by a disturbance in an area of the inner ear that helps your brain to sense movement and balance. This disturbance can be caused by a viral infection (labyrinthitis), head injury, or repetitive motion. What increases the risk? This condition is more likely to develop in:  Women.  People who are 50 years of age or older. What are the signs or symptoms? Symptoms of this condition usually happen when you move your head or your eyes in different directions. Symptoms may start suddenly, and they usually last for less than a minute. Symptoms may include:  Loss of balance and falling.  Feeling like you are spinning or moving.  Feeling like your surroundings are spinning or moving.  Nausea and vomiting.  Blurred vision.  Dizziness.  Involuntary eye movement (nystagmus). Symptoms can be mild and cause only slight annoyance, or they can be severe and interfere with daily life. Episodes of benign positional vertigo may return (recur) over time, and they may be triggered by certain movements. Symptoms may improve over time. How is this diagnosed? This condition is usually diagnosed by medical history and a physical exam of the head, neck, and ears. You may be referred to a health care provider who specializes in ear, nose, and throat (ENT) problems (otolaryngologist) or a provider who specializes in disorders of the nervous system (neurologist). You may have  additional testing, including:  MRI.  A CT scan.  Eye movement tests. Your health care provider may ask you to change positions quickly while he or she watches you for symptoms of benign positional vertigo, such as nystagmus. Eye movement may be tested with an electronystagmogram (ENG), caloric stimulation, the Dix-Hallpike test, or the roll test.  An electroencephalogram (EEG). This records electrical activity in your brain.  Hearing tests. How is this treated? Usually, your health care provider will treat this by moving your head in specific positions to adjust your inner ear back to normal. Surgery may be needed in severe cases, but this is rare. In some cases, benign positional vertigo may resolve on its own in 2-4 weeks. Follow these instructions at home: Safety  Move slowly.Avoid sudden body or head movements.  Avoid driving.  Avoid operating heavy machinery.  Avoid doing any tasks that would be dangerous to you or others if a vertigo episode would occur.  If you have trouble walking or keeping your balance, try using a cane for stability. If you feel dizzy or unstable, sit down right away.  Return to your normal activities as told by your health care provider. Ask your health care provider what activities are safe for you. General instructions  Take over-the-counter and prescription medicines only as told by your health care provider.  Avoid certain positions or movements as told by your health care provider.  Drink enough fluid to keep your urine clear or pale yellow.  Keep all follow-up visits as told by your health care provider. This is important. Contact a health care provider if:    You have a fever.  Your condition gets worse or you develop new symptoms.  Your family or friends notice any behavioral changes.  Your nausea or vomiting gets worse.  You have numbness or a "pins and needles" sensation. Get help right away if:  You have difficulty speaking or  moving.  You are always dizzy.  You faint.  You develop severe headaches.  You have weakness in your legs or arms.  You have changes in your hearing or vision.  You develop a stiff neck.  You develop sensitivity to light. This information is not intended to replace advice given to you by your health care provider. Make sure you discuss any questions you have with your health care provider. Document Released: 12/02/2005 Document Revised: 08/02/2015 Document Reviewed: 06/19/2014  2017 Elsevier  

## 2016-04-17 NOTE — Progress Notes (Signed)
Subjective:    Patient ID: Joel [name], male    DOB: Jul 24, 1945, 71 y.o.   MRN: [id number]  HPI  71 year old patient who presents with a three-day history of vertigo. He has brief dizziness, especially with bending over or extending his neck.  Symptoms seem to be improving daily and he had only very minor brief vertigo earlier this morning when he first got out of bed He describes some nasal fullness.  He has recently returned from a [place] cruise.   He also describes some mild stiff neck  Past Medical History:  Diagnosis Date  . BENIGN PROSTATIC HYPERTROPHY 05/07/2009  . COLONIC POLYPS, HX OF 05/07/2009  . HYPERLIPIDEMIA 05/07/2009  . HYPERTENSION 05/07/2009     Social History   Social History  . Marital status: Married    Spouse name: N/A  . Number of children: N/A  . Years of education: N/A   Occupational History  . Not on file.   Social History Main Topics  . Smoking status: Never Smoker  . Smokeless tobacco: Never Used  . Alcohol use Yes  . Drug use: No  . Sexual activity: Not on file   Other Topics Concern  . Not on file   Social History Narrative  . No narrative on file    Past Surgical History:  Procedure Laterality Date  . HAND TENDON SURGERY     Left thumb  . NASAL SINUS SURGERY    . NM RENAL LASIX ([facility] HX)      Family History  Problem Relation Age of Onset  . Hyperlipidemia Mother   . Hypertension Mother   . Stroke Mother   . Hyperlipidemia Father   . Hypertension Father   . Stroke Father     No Known Allergies  Current Outpatient Prescriptions on File Prior to Visit  Medication Sig Dispense Refill  . amLODipine (NORVASC) 5 MG tablet Take 1 tablet (5 mg total) by mouth daily. 90 tablet 3  . aspirin 81 MG tablet Take 81 mg by mouth daily.      . tamsulosin (FLOMAX) 0.4 MG CAPS capsule Take 1 capsule (0.4 mg total) by mouth daily. 90 capsule 6   No current facility-administered medications on file prior to visit.     BP (!)  160/82 (BP Location: Right Arm, Patient Position: Sitting, Cuff Size: Normal)   Pulse 68   Temp 97.6 F (36.4 C) (Oral)   Ht 5' 5.5" (1.664 m)   Wt 195 lb 3.2 oz (88.5 kg)   SpO2 98%   BMI 31.99 kg/m     Review of Systems  Constitutional: Negative for appetite change, chills, fatigue and fever.  HENT: Positive for congestion. Negative for dental problem, ear pain, hearing loss, sore throat, tinnitus, trouble swallowing and voice change.   Eyes: Negative for pain, discharge and visual disturbance.  Respiratory: Negative for cough, chest tightness, wheezing and stridor.   Cardiovascular: Negative for chest pain, palpitations and leg swelling.  Gastrointestinal: Negative for abdominal distention, abdominal pain, blood in stool, constipation, diarrhea, nausea and vomiting.  Genitourinary: Negative for difficulty urinating, discharge, flank pain, genital sores, hematuria and urgency.  Musculoskeletal: Positive for neck pain and neck stiffness. Negative for arthralgias, back pain, gait problem, joint swelling and myalgias.  Skin: Negative for rash.  Neurological: Positive for dizziness and light-headedness. Negative for syncope, speech difficulty, weakness, numbness and headaches.  Hematological: Negative for adenopathy. Does not bruise/bleed easily.  Psychiatric/Behavioral: Negative for behavioral problems and dysphoric mood. The patient  is not nervous/anxious.        Objective:   Physical Exam  Constitutional: He is oriented to person, place, and time. He appears well-developed.  HENT:  Head: Normocephalic.  Right Ear: External ear normal.  Left Ear: External ear normal.  No pathological nystagmus  Eyes: Conjunctivae and EOM are normal.  Neck: Normal range of motion.  Cardiovascular: Normal rate and normal heart sounds.   Pulmonary/Chest: Breath sounds normal.  Abdominal: Bowel sounds are normal.  Musculoskeletal: Normal range of motion. He exhibits no edema or tenderness.    Neurological: He is alert and oriented to person, place, and time. No cranial nerve deficit. Coordination normal.  Normal finger to nose testing Normal gait  Psychiatric: He has a normal mood and affect. His behavior is normal.          Assessment & Plan:   Benign paroxysmal positional vertigo.  Patient is improving daily.  We'll continue to observe.  Patient was given information as well as repositioning maneuvers to try if symptoms intensify Neck pain.  Will try gentle massage, heat and range of motion  Nyoka Cowden

## 2016-04-17 NOTE — Progress Notes (Signed)
Pre visit review using our clinic review tool, if applicable. No additional management support is needed unless otherwise documented below in the visit note. 

## 2016-06-11 DIAGNOSIS — H2513 Age-related nuclear cataract, bilateral: Secondary | ICD-10-CM | POA: Diagnosis not present

## 2016-07-22 DIAGNOSIS — Z85828 Personal history of other malignant neoplasm of skin: Secondary | ICD-10-CM | POA: Diagnosis not present

## 2016-07-22 DIAGNOSIS — L570 Actinic keratosis: Secondary | ICD-10-CM | POA: Diagnosis not present

## 2016-07-22 DIAGNOSIS — D225 Melanocytic nevi of trunk: Secondary | ICD-10-CM | POA: Diagnosis not present

## 2016-07-22 DIAGNOSIS — C44719 Basal cell carcinoma of skin of left lower limb, including hip: Secondary | ICD-10-CM | POA: Diagnosis not present

## 2016-07-22 DIAGNOSIS — L821 Other seborrheic keratosis: Secondary | ICD-10-CM | POA: Diagnosis not present

## 2016-08-19 DIAGNOSIS — R69 Illness, unspecified: Secondary | ICD-10-CM | POA: Diagnosis not present

## 2016-11-11 DIAGNOSIS — R972 Elevated prostate specific antigen [PSA]: Secondary | ICD-10-CM | POA: Diagnosis not present

## 2016-11-17 DIAGNOSIS — R351 Nocturia: Secondary | ICD-10-CM | POA: Diagnosis not present

## 2016-11-17 DIAGNOSIS — R972 Elevated prostate specific antigen [PSA]: Secondary | ICD-10-CM | POA: Diagnosis not present

## 2016-11-17 DIAGNOSIS — N401 Enlarged prostate with lower urinary tract symptoms: Secondary | ICD-10-CM | POA: Diagnosis not present

## 2016-11-27 ENCOUNTER — Encounter: Payer: Self-pay | Admitting: Internal Medicine

## 2017-01-28 DIAGNOSIS — R69 Illness, unspecified: Secondary | ICD-10-CM | POA: Diagnosis not present

## 2017-02-03 ENCOUNTER — Encounter: Payer: Self-pay | Admitting: Internal Medicine

## 2017-02-03 DIAGNOSIS — L821 Other seborrheic keratosis: Secondary | ICD-10-CM | POA: Diagnosis not present

## 2017-02-03 DIAGNOSIS — Z85828 Personal history of other malignant neoplasm of skin: Secondary | ICD-10-CM | POA: Diagnosis not present

## 2017-02-03 DIAGNOSIS — D044 Carcinoma in situ of skin of scalp and neck: Secondary | ICD-10-CM | POA: Diagnosis not present

## 2017-02-03 DIAGNOSIS — C44519 Basal cell carcinoma of skin of other part of trunk: Secondary | ICD-10-CM | POA: Diagnosis not present

## 2017-02-03 DIAGNOSIS — C4441 Basal cell carcinoma of skin of scalp and neck: Secondary | ICD-10-CM | POA: Diagnosis not present

## 2017-02-03 DIAGNOSIS — L570 Actinic keratosis: Secondary | ICD-10-CM | POA: Diagnosis not present

## 2017-02-03 DIAGNOSIS — D225 Melanocytic nevi of trunk: Secondary | ICD-10-CM | POA: Diagnosis not present

## 2017-02-25 DIAGNOSIS — R69 Illness, unspecified: Secondary | ICD-10-CM | POA: Diagnosis not present

## 2017-03-20 ENCOUNTER — Other Ambulatory Visit: Payer: Medicare HMO

## 2017-03-27 ENCOUNTER — Encounter: Payer: Medicare HMO | Admitting: Internal Medicine

## 2017-03-31 ENCOUNTER — Ambulatory Visit (INDEPENDENT_AMBULATORY_CARE_PROVIDER_SITE_OTHER): Payer: Medicare HMO | Admitting: Internal Medicine

## 2017-03-31 ENCOUNTER — Encounter: Payer: Self-pay | Admitting: Internal Medicine

## 2017-03-31 VITALS — BP 148/80 | HR 65 | Temp 97.7°F | Ht 65.5 in | Wt 184.4 lb

## 2017-03-31 DIAGNOSIS — E785 Hyperlipidemia, unspecified: Secondary | ICD-10-CM | POA: Diagnosis not present

## 2017-03-31 DIAGNOSIS — Z0000 Encounter for general adult medical examination without abnormal findings: Secondary | ICD-10-CM

## 2017-03-31 DIAGNOSIS — I10 Essential (primary) hypertension: Secondary | ICD-10-CM

## 2017-03-31 DIAGNOSIS — Z86010 Personal history of colonic polyps: Secondary | ICD-10-CM | POA: Diagnosis not present

## 2017-03-31 LAB — CBC WITH DIFFERENTIAL/PLATELET
Basophils Absolute: 0.1 10*3/uL (ref 0.0–0.1)
Basophils Relative: 1.2 % (ref 0.0–3.0)
EOS PCT: 8 % — AB (ref 0.0–5.0)
Eosinophils Absolute: 0.6 10*3/uL (ref 0.0–0.7)
HCT: 45.3 % (ref 39.0–52.0)
Hemoglobin: 15.3 g/dL (ref 13.0–17.0)
LYMPHS ABS: 1.6 10*3/uL (ref 0.7–4.0)
Lymphocytes Relative: 23 % (ref 12.0–46.0)
MCHC: 33.8 g/dL (ref 30.0–36.0)
MCV: 91.1 fl (ref 78.0–100.0)
MONOS PCT: 8.3 % (ref 3.0–12.0)
Monocytes Absolute: 0.6 10*3/uL (ref 0.1–1.0)
NEUTROS PCT: 59.5 % (ref 43.0–77.0)
Neutro Abs: 4.1 10*3/uL (ref 1.4–7.7)
Platelets: 235 10*3/uL (ref 150.0–400.0)
RBC: 4.97 Mil/uL (ref 4.22–5.81)
RDW: 13.7 % (ref 11.5–15.5)
WBC: 6.9 10*3/uL (ref 4.0–10.5)

## 2017-03-31 LAB — COMPREHENSIVE METABOLIC PANEL
ALK PHOS: 69 U/L (ref 39–117)
ALT: 21 U/L (ref 0–53)
AST: 22 U/L (ref 0–37)
Albumin: 4.4 g/dL (ref 3.5–5.2)
BUN: 21 mg/dL (ref 6–23)
CHLORIDE: 103 meq/L (ref 96–112)
CO2: 33 meq/L — AB (ref 19–32)
Calcium: 9.3 mg/dL (ref 8.4–10.5)
Creatinine, Ser: 0.95 mg/dL (ref 0.40–1.50)
GFR: 83.13 mL/min (ref >60.00)
GLUCOSE: 92 mg/dL (ref 70–99)
Potassium: 4.6 mEq/L (ref 3.5–5.1)
SODIUM: 140 meq/L (ref 135–145)
TOTAL PROTEIN: 7.1 g/dL (ref 6.0–8.3)
Total Bilirubin: 0.7 mg/dL (ref 0.2–1.2)

## 2017-03-31 LAB — LIPID PANEL
CHOL/HDL RATIO: 3
Cholesterol: 206 mg/dL — ABNORMAL HIGH (ref 0–200)
HDL: 74.4 mg/dL (ref >39.00)
LDL Cholesterol: 114 mg/dL — ABNORMAL HIGH (ref 0–99)
NonHDL: 132.01
Triglycerides: 91 mg/dL (ref 0.0–149.0)
VLDL: 18.2 mg/dL (ref 0.0–40.0)

## 2017-03-31 LAB — TSH: TSH: 1.61 u[IU]/mL (ref 0.35–4.50)

## 2017-03-31 MED ORDER — AMLODIPINE BESYLATE 10 MG PO TABS: 10.0000 mg | ORAL_TABLET | Freq: Every day | ORAL | 5 refills | Status: DC

## 2017-03-31 NOTE — Progress Notes (Signed)
Subjective:    Patient ID: Joel [name], male    DOB: [birth year], 72 y.o.   MRN: [id number]  HPI  72 year old patient who is seen today for a annual preventive health examination and subsequent Medicare wellness visit He has a history of essential hypertension.  Presently he is on amlodipine 5 mg daily.  He states his average blood pressure readings over time are 142/80.  He had been on hydrochlorothiazide which was discontinued due to an episode of gouty arthritis. He has a history of colonic polyps and has been notified by [facility] for follow-up. He has been seen by urology in July due to a history of BPH and elevated PSA.  He did have a prostate biopsy 2 or 3 years ago.  He is also followed by dermatology.  Past Medical History:  Diagnosis Date  . BENIGN PROSTATIC HYPERTROPHY 05/07/2009  . COLONIC POLYPS, HX OF 05/07/2009  . HYPERLIPIDEMIA 05/07/2009  . HYPERTENSION 05/07/2009     Social History   Socioeconomic History  . Marital status: Married    Spouse name: Not on file  . Number of children: Not on file  . Years of education: Not on file  . Highest education level: Not on file  Social Needs  . Financial resource strain: Not on file  . Food insecurity - worry: Not on file  . Food insecurity - inability: Not on file  . Transportation needs - medical: Not on file  . Transportation needs - non-medical: Not on file  Occupational History  . Not on file  Tobacco Use  . Smoking status: Never Smoker  . Smokeless tobacco: Never Used  Substance and Sexual Activity  . Alcohol use: Yes  . Drug use: No  . Sexual activity: Not on file  Other Topics Concern  . Not on file  Social History Narrative  . Not on file    Past Surgical History:  Procedure Laterality Date  . HAND TENDON SURGERY     Left thumb  . NASAL SINUS SURGERY    . NM RENAL LASIX ([facility] HX)      Family History  Problem Relation Age of Onset  . Hyperlipidemia Mother   . Hypertension Mother   . Stroke  Mother   . Hyperlipidemia Father   . Hypertension Father   . Stroke Father     No Known Allergies  Current Outpatient Medications on File Prior to Visit  Medication Sig Dispense Refill  . amLODipine (NORVASC) 5 MG tablet Take 1 tablet (5 mg total) by mouth daily. 90 tablet 3  . aspirin 81 MG tablet Take 81 mg by mouth daily.      . tamsulosin (FLOMAX) 0.4 MG CAPS capsule Take 1 capsule (0.4 mg total) by mouth daily. (Patient taking differently: Take 0.8 mg by mouth daily. ) 90 capsule 6   No current facility-administered medications on file prior to visit.     BP (!) 148/80 (BP Location: Left Arm, Patient Position: Sitting, Cuff Size: Normal)   Pulse 65   Temp 97.7 F (36.5 C) (Oral)   Ht 5' 5.5" (1.664 m)   Wt 184 lb 6.4 oz (83.6 kg)   SpO2 97%   BMI 30.22 kg/m   Subsequent Medicare wellness visit  1. Risk factors, based on past  M,S,F history.  Cardiovascular risk factors include hypertension only  2.  Physical activities: Remains fairly active.  Active with travel  3.  Depression/mood: No history of major depression or mood disorder  4.  Hearing: No deficits  5.  ADL's: Independent  6.  Fall risk: Low  7.  Home safety: No problems identified  8.  Height weight, and visual acuity; height and weight stable no change in visual acuity  9.  Counseling: More rigorous exercise weight loss encouraged  10. Lab orders based on risk factors: Laboratory update will be reviewed  11. Referral : Follow-up urology annually  12. Care plan: Continue efforts at aggressive risk factor modification  13. Cognitive assessment: Alert and oriented with normal affect.  No cognitive dysfunction 14. Screening: Patient provided with a written and personalized 5-10 year screening schedule in the AVS.    15. Provider List Update: Primary care urology GI and dermatology   Review of Systems  Constitutional: Negative for appetite change, chills, fatigue and fever.  HENT: Negative for  congestion, dental problem, ear pain, hearing loss, sore throat, tinnitus, trouble swallowing and voice change.   Eyes: Negative for pain, discharge and visual disturbance.  Respiratory: Negative for cough, chest tightness, wheezing and stridor.   Cardiovascular: Negative for chest pain, palpitations and leg swelling.  Gastrointestinal: Negative for abdominal distention, abdominal pain, blood in stool, constipation, diarrhea, nausea and vomiting.  Genitourinary: Negative for difficulty urinating, discharge, flank pain, genital sores, hematuria and urgency.  Musculoskeletal: Negative for arthralgias, back pain, gait problem, joint swelling, myalgias and neck stiffness.  Skin: Negative for rash.  Neurological: Negative for dizziness, syncope, speech difficulty, weakness, numbness and headaches.  Hematological: Negative for adenopathy. Does not bruise/bleed easily.  Psychiatric/Behavioral: Negative for behavioral problems and dysphoric mood. The patient is not nervous/anxious.        Objective:   Physical Exam  Constitutional: He appears well-developed and well-nourished.  Weight 184 Blood pressure 144/80  HENT:  Head: Normocephalic and atraumatic.  Right Ear: External ear normal.  Left Ear: External ear normal.  Nose: Nose normal.  Mouth/Throat: Oropharynx is clear and moist.  Eyes: Conjunctivae and EOM are normal. Pupils are equal, round, and reactive to light. No scleral icterus.  Neck: Normal range of motion. Neck supple. No JVD present. No thyromegaly present.  Cardiovascular: Regular rhythm, normal heart sounds and intact distal pulses. Exam reveals no gallop and no friction rub.  No murmur heard. Pedal pulses not easily palpable but feet are well perfused  Pulmonary/Chest: Effort normal and breath sounds normal. He exhibits no tenderness.  Abdominal: Soft. Bowel sounds are normal. He exhibits no distension and no mass. There is no tenderness.  Genitourinary: Prostate normal and  penis normal.  Musculoskeletal: Normal range of motion. He exhibits no edema or tenderness.  Lymphadenopathy:    He has no cervical adenopathy.  Neurological: He is alert. He has normal reflexes. No cranial nerve deficit. Coordination normal.  Skin: Skin is warm and dry. No rash noted.  Multiple scarring especially of the back area from prior resections of skin lesions  Psychiatric: He has a normal mood and affect. His behavior is normal.          Assessment & Plan:   Preventive health examination.  Will review updated lab Subsequent Medicare wellness visit Essential hypertension.  We will increase amlodipine to 10 mg daily.  We will continue home blood pressure monitoring BPH/elevated PSA.  Follow-up urology annually History of colonic polyps.  Follow-up colonoscopy as scheduled  Return in 1 year or as needed  [employer]

## 2017-03-31 NOTE — Patient Instructions (Addendum)
Increase amlodipine to 10 mg daily  Limit your sodium (Salt) intake  Please check your blood pressure on a regular basis.  If it is consistently greater than 140/90, please make an office appointment.    It is important that you exercise regularly, at least 20 minutes 3 to 4 times per week.  If you develop chest pain or shortness of breath seek  medical attention.  You need to lose weight.  Consider a lower calorie diet and regular exercise.      DASH Eating Plan DASH stands for "Dietary Approaches to Stop Hypertension." The DASH eating plan is a healthy eating plan that has been shown to reduce high blood pressure (hypertension). It may also reduce your risk for type 2 diabetes, heart disease, and stroke. The DASH eating plan may also help with weight loss. What are tips for following this plan? General guidelines  Avoid eating more than 2,300 mg (milligrams) of salt (sodium) a day. If you have hypertension, you may need to reduce your sodium intake to 1,500 mg a day.  Limit alcohol intake to no more than 1 drink a day for nonpregnant women and 2 drinks a day for men. One drink equals 12 oz of beer, 5 oz of wine, or 1 oz of hard liquor.  Work with your health care provider to maintain a healthy body weight or to lose weight. Ask what an ideal weight is for you.  Get at least 30 minutes of exercise that causes your heart to beat faster (aerobic exercise) most days of the week. Activities may include walking, swimming, or biking.  Work with your health care provider or diet and nutrition specialist (dietitian) to adjust your eating plan to your individual calorie needs. Reading food labels  Check food labels for the amount of sodium per serving. Choose foods with less than 5 percent of the Daily Value of sodium. Generally, foods with less than 300 mg of sodium per serving fit into this eating plan.  To find whole grains, look for the word "whole" as the first word in the ingredient  list. Shopping  Buy products labeled as "low-sodium" or "no salt added."  Buy fresh foods. Avoid canned foods and premade or frozen meals. Cooking  Avoid adding salt when cooking. Use salt-free seasonings or herbs instead of table salt or sea salt. Check with your health care provider or pharmacist before using salt substitutes.  Do not fry foods. Cook foods using healthy methods such as baking, boiling, grilling, and broiling instead.  Cook with heart-healthy oils, such as olive, canola, soybean, or sunflower oil. Meal planning   Eat a balanced diet that includes: ? 5 or more servings of fruits and vegetables each day. At each meal, try to fill half of your plate with fruits and vegetables. ? Up to 6-8 servings of whole grains each day. ? Less than 6 oz of lean meat, poultry, or fish each day. A 3-oz serving of meat is about the same size as a deck of cards. One egg equals 1 oz. ? 2 servings of low-fat dairy each day. ? A serving of nuts, seeds, or beans 5 times each week. ? Heart-healthy fats. Healthy fats called Omega-3 fatty acids are found in foods such as flaxseeds and coldwater fish, like sardines, salmon, and mackerel.  Limit how much you eat of the following: ? Canned or prepackaged foods. ? Food that is high in trans fat, such as fried foods. ? Food that is high in saturated  fat, such as fatty meat. ? Sweets, desserts, sugary drinks, and other foods with added sugar. ? Full-fat dairy products.  Do not salt foods before eating.  Try to eat at least 2 vegetarian meals each week.  Eat more home-cooked food and less restaurant, buffet, and fast food.  When eating at a restaurant, ask that your food be prepared with less salt or no salt, if possible. What foods are recommended? The items listed may not be a complete list. Talk with your dietitian about what dietary choices are best for you. Grains Whole-grain or whole-wheat bread. Whole-grain or whole-wheat pasta. Brown  rice. [place]. Bulgur. Whole-grain and low-sodium cereals. Pita bread. Low-fat, low-sodium crackers. Whole-wheat flour tortillas. Vegetables Fresh or frozen vegetables (raw, steamed, roasted, or grilled). Low-sodium or reduced-sodium tomato and vegetable juice. Low-sodium or reduced-sodium tomato sauce and tomato paste. Low-sodium or reduced-sodium canned vegetables. Fruits All fresh, dried, or frozen fruit. Canned fruit in natural juice (without added sugar). Meat and other protein foods Skinless chicken or [place]. Ground chicken or [place]. Pork with fat trimmed off. Fish and seafood. Egg whites. Dried beans, peas, or lentils. Unsalted nuts, nut butters, and seeds. Unsalted canned beans. Lean cuts of beef with fat trimmed off. Low-sodium, lean deli meat. Dairy Low-fat (1%) or fat-free (skim) milk. Fat-free, low-fat, or reduced-fat cheeses. Nonfat, low-sodium ricotta or cottage cheese. Low-fat or nonfat yogurt. Low-fat, low-sodium cheese. Fats and oils Soft margarine without trans fats. Vegetable oil. Low-fat, reduced-fat, or light mayonnaise and salad dressings (reduced-sodium). Canola, safflower, olive, soybean, and sunflower oils. Avocado. Seasoning and other foods Herbs. Spices. Seasoning mixes without salt. Unsalted popcorn and pretzels. Fat-free sweets. What foods are not recommended? The items listed may not be a complete list. Talk with your dietitian about what dietary choices are best for you. Grains Baked goods made with fat, such as croissants, muffins, or some breads. Dry pasta or rice meal packs. Vegetables Creamed or fried vegetables. Vegetables in a cheese sauce. Regular canned vegetables (not low-sodium or reduced-sodium). Regular canned tomato sauce and paste (not low-sodium or reduced-sodium). Regular tomato and vegetable juice (not low-sodium or reduced-sodium). Angie Fava. Olives. Fruits Canned fruit in a light or heavy syrup. Fried fruit. Fruit in cream or butter  sauce. Meat and other protein foods Fatty cuts of meat. Ribs. Fried meat. Berniece Salines. Sausage. Bologna and other processed lunch meats. Salami. Fatback. Hotdogs. Bratwurst. Salted nuts and seeds. Canned beans with added salt. Canned or smoked fish. Whole eggs or egg yolks. Chicken or [place] with skin. Dairy Whole or 2% milk, cream, and half-and-half. Whole or full-fat cream cheese. Whole-fat or sweetened yogurt. Full-fat cheese. Nondairy creamers. Whipped toppings. Processed cheese and cheese spreads. Fats and oils Butter. Stick margarine. Lard. Shortening. Ghee. Bacon fat. Tropical oils, such as coconut, palm kernel, or palm oil. Seasoning and other foods Salted popcorn and pretzels. Onion salt, garlic salt, seasoned salt, table salt, and sea salt. Worcestershire sauce. Tartar sauce. Barbecue sauce. Teriyaki sauce. Soy sauce, including reduced-sodium. Steak sauce. Canned and packaged gravies. Fish sauce. Oyster sauce. Cocktail sauce. Horseradish that you find on the shelf. Ketchup. Mustard. Meat flavorings and tenderizers. Bouillon cubes. Hot sauce and Tabasco sauce. Premade or packaged marinades. Premade or packaged taco seasonings. Relishes. Regular salad dressings. Where to find more information:  National Heart, Lung, and [place]: https://wilson-eaton.com/  American Heart Association: www.heart.org Summary  The DASH eating plan is a healthy eating plan that has been shown to reduce high blood pressure (hypertension). It may also reduce  your risk for type 2 diabetes, heart disease, and stroke.  With the DASH eating plan, you should limit salt (sodium) intake to 2,300 mg a day. If you have hypertension, you may need to reduce your sodium intake to 1,500 mg a day.  When on the DASH eating plan, aim to eat more fresh fruits and vegetables, whole grains, lean proteins, low-fat dairy, and heart-healthy fats.  Work with your health care provider or diet and nutrition specialist (dietitian) to adjust  your eating plan to your individual calorie needs. This information is not intended to replace advice given to you by your health care provider. Make sure you discuss any questions you have with your health care provider. Document Released: 02/13/2011 Document Revised: 02/18/2016 Document Reviewed: 02/18/2016 Elsevier Interactive Patient Education  [employer].

## 2017-04-01 LAB — HEPATITIS C ANTIBODY
HEP C AB: NONREACTIVE
SIGNAL TO CUT-OFF: 0.05 (ref <1.00)

## 2017-05-15 DIAGNOSIS — Z86010 Personal history of colonic polyps: Secondary | ICD-10-CM | POA: Diagnosis not present

## 2017-05-15 LAB — HM COLONOSCOPY

## 2017-07-28 DIAGNOSIS — L570 Actinic keratosis: Secondary | ICD-10-CM | POA: Diagnosis not present

## 2017-07-28 DIAGNOSIS — D225 Melanocytic nevi of trunk: Secondary | ICD-10-CM | POA: Diagnosis not present

## 2017-07-28 DIAGNOSIS — Z85828 Personal history of other malignant neoplasm of skin: Secondary | ICD-10-CM | POA: Diagnosis not present

## 2017-07-28 DIAGNOSIS — L821 Other seborrheic keratosis: Secondary | ICD-10-CM | POA: Diagnosis not present

## 2017-07-28 DIAGNOSIS — L920 Granuloma annulare: Secondary | ICD-10-CM | POA: Diagnosis not present

## 2017-09-02 DIAGNOSIS — R69 Illness, unspecified: Secondary | ICD-10-CM | POA: Diagnosis not present

## 2017-09-21 DIAGNOSIS — H2513 Age-related nuclear cataract, bilateral: Secondary | ICD-10-CM | POA: Diagnosis not present

## 2017-10-12 DIAGNOSIS — L821 Other seborrheic keratosis: Secondary | ICD-10-CM | POA: Diagnosis not present

## 2017-10-12 DIAGNOSIS — Z85828 Personal history of other malignant neoplasm of skin: Secondary | ICD-10-CM | POA: Diagnosis not present

## 2017-10-12 DIAGNOSIS — L218 Other seborrheic dermatitis: Secondary | ICD-10-CM | POA: Diagnosis not present

## 2017-10-12 DIAGNOSIS — L920 Granuloma annulare: Secondary | ICD-10-CM | POA: Diagnosis not present

## 2017-10-12 DIAGNOSIS — I788 Other diseases of capillaries: Secondary | ICD-10-CM | POA: Diagnosis not present

## 2017-10-12 DIAGNOSIS — L570 Actinic keratosis: Secondary | ICD-10-CM | POA: Diagnosis not present

## 2017-11-16 DIAGNOSIS — R972 Elevated prostate specific antigen [PSA]: Secondary | ICD-10-CM | POA: Diagnosis not present

## 2017-11-23 DIAGNOSIS — N401 Enlarged prostate with lower urinary tract symptoms: Secondary | ICD-10-CM | POA: Diagnosis not present

## 2017-11-23 DIAGNOSIS — R972 Elevated prostate specific antigen [PSA]: Secondary | ICD-10-CM | POA: Diagnosis not present

## 2017-11-23 DIAGNOSIS — R351 Nocturia: Secondary | ICD-10-CM | POA: Diagnosis not present

## 2017-12-21 DIAGNOSIS — M25512 Pain in left shoulder: Secondary | ICD-10-CM | POA: Diagnosis not present

## 2018-01-11 DIAGNOSIS — Z23 Encounter for immunization: Secondary | ICD-10-CM | POA: Diagnosis not present

## 2018-01-25 DIAGNOSIS — M25512 Pain in left shoulder: Secondary | ICD-10-CM | POA: Diagnosis not present

## 2018-02-09 DIAGNOSIS — C44519 Basal cell carcinoma of skin of other part of trunk: Secondary | ICD-10-CM | POA: Diagnosis not present

## 2018-02-09 DIAGNOSIS — L910 Hypertrophic scar: Secondary | ICD-10-CM | POA: Diagnosis not present

## 2018-02-09 DIAGNOSIS — Z85828 Personal history of other malignant neoplasm of skin: Secondary | ICD-10-CM | POA: Diagnosis not present

## 2018-02-09 DIAGNOSIS — D485 Neoplasm of uncertain behavior of skin: Secondary | ICD-10-CM | POA: Diagnosis not present

## 2018-02-09 DIAGNOSIS — L738 Other specified follicular disorders: Secondary | ICD-10-CM | POA: Diagnosis not present

## 2018-02-09 DIAGNOSIS — L821 Other seborrheic keratosis: Secondary | ICD-10-CM | POA: Diagnosis not present

## 2018-02-09 DIAGNOSIS — L570 Actinic keratosis: Secondary | ICD-10-CM | POA: Diagnosis not present

## 2018-02-09 DIAGNOSIS — D225 Melanocytic nevi of trunk: Secondary | ICD-10-CM | POA: Diagnosis not present

## 2018-03-29 DIAGNOSIS — R69 Illness, unspecified: Secondary | ICD-10-CM | POA: Diagnosis not present

## 2018-04-21 ENCOUNTER — Encounter: Payer: Self-pay | Admitting: Family Medicine

## 2018-04-21 ENCOUNTER — Ambulatory Visit (INDEPENDENT_AMBULATORY_CARE_PROVIDER_SITE_OTHER): Payer: Medicare HMO | Admitting: Family Medicine

## 2018-04-21 VITALS — BP 140/84 | HR 68 | Temp 98.0°F | Ht 65.0 in | Wt 191.0 lb

## 2018-04-21 DIAGNOSIS — N401 Enlarged prostate with lower urinary tract symptoms: Secondary | ICD-10-CM | POA: Diagnosis not present

## 2018-04-21 DIAGNOSIS — Z86010 Personal history of colonic polyps: Secondary | ICD-10-CM | POA: Diagnosis not present

## 2018-04-21 DIAGNOSIS — R351 Nocturia: Secondary | ICD-10-CM | POA: Diagnosis not present

## 2018-04-21 DIAGNOSIS — E785 Hyperlipidemia, unspecified: Secondary | ICD-10-CM | POA: Diagnosis not present

## 2018-04-21 DIAGNOSIS — I10 Essential (primary) hypertension: Secondary | ICD-10-CM | POA: Diagnosis not present

## 2018-04-21 DIAGNOSIS — Z23 Encounter for immunization: Secondary | ICD-10-CM

## 2018-04-21 LAB — CBC WITH DIFFERENTIAL/PLATELET
Basophils Absolute: 0.1 10*3/uL (ref 0.0–0.1)
Basophils Relative: 1.3 % (ref 0.0–3.0)
Eosinophils Absolute: 0.8 10*3/uL — ABNORMAL HIGH (ref 0.0–0.7)
Eosinophils Relative: 9.8 % — ABNORMAL HIGH (ref 0.0–5.0)
HCT: 43.1 % (ref 39.0–52.0)
Hemoglobin: 14.7 g/dL (ref 13.0–17.0)
Lymphocytes Relative: 24.1 % (ref 12.0–46.0)
Lymphs Abs: 2 10*3/uL (ref 0.7–4.0)
MCHC: 34.1 g/dL (ref 30.0–36.0)
MCV: 89.9 fl (ref 78.0–100.0)
Monocytes Absolute: 0.6 10*3/uL (ref 0.1–1.0)
Monocytes Relative: 6.9 % (ref 3.0–12.0)
NEUTROS ABS: 4.8 10*3/uL (ref 1.4–7.7)
Neutrophils Relative %: 57.9 % (ref 43.0–77.0)
PLATELETS: 233 10*3/uL (ref 150.0–400.0)
RBC: 4.79 Mil/uL (ref 4.22–5.81)
RDW: 13.2 % (ref 11.5–15.5)
WBC: 8.3 10*3/uL (ref 4.0–10.5)

## 2018-04-21 LAB — LIPID PANEL
Cholesterol: 198 mg/dL (ref 0–200)
HDL: 64.4 mg/dL (ref >39.00)
LDL Cholesterol: 116 mg/dL — ABNORMAL HIGH (ref 0–99)
NonHDL: 134.01
TRIGLYCERIDES: 91 mg/dL (ref 0.0–149.0)
Total CHOL/HDL Ratio: 3
VLDL: 18.2 mg/dL (ref 0.0–40.0)

## 2018-04-21 LAB — COMPREHENSIVE METABOLIC PANEL
ALT: 24 U/L (ref 0–53)
AST: 24 U/L (ref 0–37)
Albumin: 4.6 g/dL (ref 3.5–5.2)
Alkaline Phosphatase: 72 U/L (ref 39–117)
BUN: 18 mg/dL (ref 6–23)
CO2: 29 mEq/L (ref 19–32)
Calcium: 9.6 mg/dL (ref 8.4–10.5)
Chloride: 102 mEq/L (ref 96–112)
Creatinine, Ser: 0.98 mg/dL (ref 0.40–1.50)
GFR: 75.23 mL/min (ref >60.00)
Glucose, Bld: 87 mg/dL (ref 70–99)
Potassium: 4.3 mEq/L (ref 3.5–5.1)
Sodium: 139 mEq/L (ref 135–145)
Total Bilirubin: 0.6 mg/dL (ref 0.2–1.2)
Total Protein: 7 g/dL (ref 6.0–8.3)

## 2018-04-21 LAB — TSH: TSH: 1.13 u[IU]/mL (ref 0.35–4.50)

## 2018-04-21 MED ORDER — ZOSTER VAC RECOMB ADJUVANTED 50 MCG/0.5ML IM SUSR: 0.5000 mL | Freq: Once | INTRAMUSCULAR | 0 refills | Status: AC

## 2018-04-21 NOTE — Progress Notes (Signed)
LAVAN IMES DOB: Jul 10, 1945 Encounter date: 04/21/2018  This is a 73 y.o. male who presents to establish care. Chief Complaint  Patient presents with  . Transitions Of Care    History of present illness:  Last saw Dr. Raliegh Ip for preventative visit in 03/31/17:usually keeps just an annual physical visit.   Has some arthritis in left shoulder - flares every now and then. Has had cortisone shot about 6 months ago which helped some, but still aches on occasion. Currently uses ice and notes increase flare after more activity. Notes sometimes more on damp days. Dull ache. Arthritis. Takes medication about twice weekly for this when flares.   HTN: hctz stopped due to gouty arthritis; amlodipine increased from 5 to 10mg  at last visit. BP usually stays about 469 systolic. Usually over 80 sometimes lower. Sometimes gets swelling in lower legs/ankle, but doesn't bother him.    Colon polyps: was supposed to follow with [facility]. Had colonoscopy completed in 2019. Repeat 5 years per patient.   Follows with urology for hx of BPH and elevated PSA; had prostate bx in past. Follows annually. PSA runs a little high, but they do monitor and biopsies have been ok (last biopsies maybe 3 years ago).   Follows with derm:amy [place] on twice yearly basis.   Cholesterol well controlled:not on medication.  Lipid Panel     Component Value Date/Time   CHOL 198 04/21/2018 1345   TRIG 91.0 04/21/2018 1345   HDL 64.40 04/21/2018 1345   CHOLHDL 3 04/21/2018 1345   VLDL 18.2 04/21/2018 1345   LDLCALC 116 (H) 04/21/2018 1345   LDLDIRECT 137.6 09/17/2012 0931     Completed flu shot at [facility] this year.   Past Medical History:  Diagnosis Date  . BENIGN PROSTATIC HYPERTROPHY 05/07/2009  . COLONIC POLYPS, HX OF 05/07/2009  . HYPERLIPIDEMIA 05/07/2009  . HYPERTENSION 05/07/2009   Past Surgical History:  Procedure Laterality Date  . HAND TENDON SURGERY     Left thumb  . NASAL SINUS SURGERY    . NM RENAL LASIX ([facility]  HX)     No Known Allergies Current Meds  Medication Sig  . amLODipine (NORVASC) 10 MG tablet Take 1 tablet (10 mg total) by mouth daily.  Marland Kitchen aspirin 81 MG tablet Take 81 mg by mouth daily.    . tamsulosin (FLOMAX) 0.4 MG CAPS capsule Take 1 capsule (0.4 mg total) by mouth daily. (Patient taking differently: Take 0.8 mg by mouth daily. )   Social History   Tobacco Use  . Smoking status: Never Smoker  . Smokeless tobacco: Never Used  Substance Use Topics  . Alcohol use: Yes   Family History  Problem Relation Age of Onset  . Hyperlipidemia Mother   . Hypertension Mother   . Stroke Mother   . Hyperlipidemia Father   . Hypertension Father   . Stroke Father      Review of Systems  Constitutional: Negative for chills, fatigue and fever.  Respiratory: Negative for cough, chest tightness, shortness of breath and wheezing.   Cardiovascular: Negative for chest pain, palpitations and leg swelling.  Gastrointestinal: Negative for abdominal pain, blood in stool, constipation, diarrhea and nausea.  Genitourinary:       Urinates a couple of times nightly; stream slightly slower than in past but feels he completely empties    Objective:  BP 140/84 (BP Location: Left Arm, Patient Position: Sitting, Cuff Size: Normal)   Pulse 68   Temp 98 F (36.7 C) (Oral)  Ht 5\' 5"  (1.651 m)   Wt 191 lb (86.6 kg)   SpO2 98%   BMI 31.78 kg/m   Weight: 191 lb (86.6 kg)   BP Readings from Last 3 Encounters:  04/21/18 140/84  03/31/17 (!) 148/80  04/17/16 (!) 160/82   Wt Readings from Last 3 Encounters:  04/21/18 191 lb (86.6 kg)  03/31/17 184 lb 6.4 oz (83.6 kg)  04/17/16 195 lb 3.2 oz (88.5 kg)    Physical Exam Constitutional:      General: He is not in acute distress.    Appearance: He is well-developed.  HENT:     Head: Normocephalic and atraumatic.     Right Ear: External ear normal.     Left Ear: External ear normal.     Nose: Nose normal.     Mouth/Throat:     Pharynx: No  oropharyngeal exudate.  Eyes:     Conjunctiva/sclera: Conjunctivae normal.     Pupils: Pupils are equal, round, and reactive to light.  Neck:     Musculoskeletal: Neck supple.     Thyroid: No thyromegaly.  Cardiovascular:     Rate and Rhythm: Normal rate and regular rhythm.     Heart sounds: Normal heart sounds. No murmur. No friction rub. No gallop.   Pulmonary:     Effort: Pulmonary effort is normal. No respiratory distress.     Breath sounds: Normal breath sounds. No stridor. No wheezing or rales.  Abdominal:     General: Bowel sounds are normal.     Palpations: Abdomen is soft.  Musculoskeletal: Normal range of motion.  Skin:    General: Skin is warm and dry.  Neurological:     Mental Status: He is alert and oriented to person, place, and time.     Cranial Nerves: No cranial nerve deficit.     Motor: No weakness.     Deep Tendon Reflexes: Reflexes normal.  Psychiatric:        Behavior: Behavior normal.        Thought Content: Thought content normal.        Judgment: Judgment normal.     Assessment/Plan: 1. Essential hypertension Continue with amlodipine at 10mg  daily dose. - CBC with Differential/Platelet; Future - Comprehensive metabolic panel; Future - CBC with Differential/Platelet - Comprehensive metabolic panel  2. Benign prostatic hyperplasia with nocturia Follows with urology. On flomax.  3. Dyslipidemia Will recheck today; not currently on medication. - Lipid panel; Future - TSH; Future - Lipid panel - TSH  4. History of colonic polyps Follows with Gi; previous colonoscopy report requested.  5. Need for shingles vaccine Insurance may not cover; he has been on waiting list at pharmacy. Will send over vaccine rx for him and he will ask again about completing this. - Zoster Vaccine Adjuvanted [facility]) injection; Inject 0.5 mLs into the muscle once for 1 dose. Repeat in 2-6 months  Dispense: 0.5 mL; Refill: 0  Return in about 6 months (around  10/20/2018) for AWV with Raquel Sarna in 6 months.  Micheline Rough, MD

## 2018-05-19 ENCOUNTER — Encounter: Payer: Self-pay | Admitting: Family Medicine

## 2018-06-23 ENCOUNTER — Other Ambulatory Visit: Payer: Self-pay | Admitting: Family Medicine

## 2018-07-26 ENCOUNTER — Encounter: Payer: Self-pay | Admitting: Family Medicine

## 2018-07-26 NOTE — Telephone Encounter (Signed)
Seems like this would make most sense for in office eval; but we can try to do by doxy if willing. Also ok for doxy with another provider if they have opening and he is willing.

## 2018-07-27 ENCOUNTER — Ambulatory Visit (INDEPENDENT_AMBULATORY_CARE_PROVIDER_SITE_OTHER): Payer: Medicare HMO | Admitting: Family Medicine

## 2018-07-27 ENCOUNTER — Other Ambulatory Visit: Payer: Self-pay

## 2018-07-27 DIAGNOSIS — M436 Torticollis: Secondary | ICD-10-CM | POA: Diagnosis not present

## 2018-07-27 NOTE — Progress Notes (Signed)
Patient ID: Joel [name], male   DOB: 1945/05/09, 73 y.o.   MRN: [id number]  This visit type was conducted due to national recommendations for restrictions regarding the COVID-19 pandemic in an effort to limit this patient's exposure and mitigate transmission in our community.   Virtual Visit via Video Note  I connected with Joel [name] on 07/27/18 at  9:45 AM EDT by a video enabled telemedicine application and verified that I am speaking with the correct person using two identifiers.  Location patient: home Location provider:work or home office Persons participating in the virtual visit: patient, provider  I discussed the limitations of evaluation and management by telemedicine and the availability of in person appointments. The patient expressed understanding and agreed to proceed.   HPI: Patient consulted regarding some neck stiffness and mild soreness near the center of his neck.  He states Sunday afternoon he was sitting around reading and noticed some stiffness afterwards.  He denies falling asleep in an awkward position.  No known injury.  He states he has a sensation as if there is a "weight "at the back of his neck.  No chest pain.  No cough.  No dyspnea.  No fever.  He denies any significant headache.  No numbness or weakness upper extremities.  No nausea or vomiting.  Good appetite.  He has taken some Tylenol and has tried some heat with minimal improvement.  He states his symptoms are about 20% better compared with yesterday.  Denies any recent change of pillow.  No recent tick bites.   ROS: See pertinent positives and negatives per HPI.  Past Medical History:  Diagnosis Date  . BENIGN PROSTATIC HYPERTROPHY 05/07/2009  . COLONIC POLYPS, HX OF 05/07/2009  . HYPERLIPIDEMIA 05/07/2009  . HYPERTENSION 05/07/2009    Past Surgical History:  Procedure Laterality Date  . HAND TENDON SURGERY     Left thumb  . NASAL SINUS SURGERY    . NM RENAL LASIX ([facility] HX)      Family  History  Problem Relation Age of Onset  . Hyperlipidemia Mother   . Hypertension Mother   . Stroke Mother   . Hyperlipidemia Father   . Hypertension Father   . Stroke Father     SOCIAL HX: Non-smoker   Current Outpatient Medications:  .  amLODipine (NORVASC) 10 MG tablet, TAKE 1 TABLET BY MOUTH EVERY DAY, Disp: 90 tablet, Rfl: 5 .  aspirin 81 MG tablet, Take 81 mg by mouth daily.  , Disp: , Rfl:  .  tamsulosin (FLOMAX) 0.4 MG CAPS capsule, Take 1 capsule (0.4 mg total) by mouth daily. (Patient taking differently: Take 0.8 mg by mouth daily. ), Disp: 90 capsule, Rfl: 6  EXAM:  VITALS per patient if applicable:  GENERAL: alert, oriented, appears well and in no acute distress  HEENT: atraumatic, conjunttiva clear, no obvious abnormalities on inspection of external nose and ears  NECK: normal movements of the head and neck  LUNGS: on inspection no signs of respiratory distress, breathing rate appears normal, no obvious gross SOB, gasping or wheezing  CV: no obvious cyanosis  MS: moves all visible extremities without noticeable abnormality  PSYCH/NEURO: pleasant and cooperative, no obvious depression or anxiety, speech and thought processing grossly intact  ASSESSMENT AND PLAN:  Discussed the following assessment and plan:  Neck stiffness.  Does not describe any meningismus.  No red flags such as fever, recent injury, or any focal neurologic deficits.  Sounds more musculoskeletal  -Recommend conservative measures with heat  and topical sports cream and gentle range of motion  -Follow-up with primary if symptoms not continuing to improve over the next few days  -Follow-up sooner for any fever, worsening pain, upper extremity numbness or weakness or other concerns     I discussed the assessment and treatment plan with the patient. The patient was provided an opportunity to ask questions and all were answered. The patient agreed with the plan and demonstrated an understanding  of the instructions.   The patient was advised to call back or seek an in-person evaluation if the symptoms worsen or if the condition fails to improve as anticipated.   Carolann Littler, MD

## 2018-08-10 DIAGNOSIS — D1801 Hemangioma of skin and subcutaneous tissue: Secondary | ICD-10-CM | POA: Diagnosis not present

## 2018-08-10 DIAGNOSIS — Z85828 Personal history of other malignant neoplasm of skin: Secondary | ICD-10-CM | POA: Diagnosis not present

## 2018-08-10 DIAGNOSIS — D225 Melanocytic nevi of trunk: Secondary | ICD-10-CM | POA: Diagnosis not present

## 2018-08-10 DIAGNOSIS — L821 Other seborrheic keratosis: Secondary | ICD-10-CM | POA: Diagnosis not present

## 2018-08-10 DIAGNOSIS — L820 Inflamed seborrheic keratosis: Secondary | ICD-10-CM | POA: Diagnosis not present

## 2018-08-10 DIAGNOSIS — L738 Other specified follicular disorders: Secondary | ICD-10-CM | POA: Diagnosis not present

## 2018-08-10 DIAGNOSIS — D692 Other nonthrombocytopenic purpura: Secondary | ICD-10-CM | POA: Diagnosis not present

## 2018-08-10 DIAGNOSIS — L570 Actinic keratosis: Secondary | ICD-10-CM | POA: Diagnosis not present

## 2018-08-10 DIAGNOSIS — D485 Neoplasm of uncertain behavior of skin: Secondary | ICD-10-CM | POA: Diagnosis not present

## 2018-09-28 DIAGNOSIS — R69 Illness, unspecified: Secondary | ICD-10-CM | POA: Diagnosis not present

## 2018-10-14 DIAGNOSIS — H2513 Age-related nuclear cataract, bilateral: Secondary | ICD-10-CM | POA: Diagnosis not present

## 2018-10-14 DIAGNOSIS — H5203 Hypermetropia, bilateral: Secondary | ICD-10-CM | POA: Diagnosis not present

## 2018-10-14 DIAGNOSIS — H52203 Unspecified astigmatism, bilateral: Secondary | ICD-10-CM | POA: Diagnosis not present

## 2018-10-14 DIAGNOSIS — H524 Presbyopia: Secondary | ICD-10-CM | POA: Diagnosis not present

## 2018-10-20 ENCOUNTER — Ambulatory Visit: Payer: Medicare HMO

## 2018-12-06 DIAGNOSIS — R351 Nocturia: Secondary | ICD-10-CM | POA: Diagnosis not present

## 2018-12-06 DIAGNOSIS — R972 Elevated prostate specific antigen [PSA]: Secondary | ICD-10-CM | POA: Diagnosis not present

## 2018-12-06 DIAGNOSIS — N401 Enlarged prostate with lower urinary tract symptoms: Secondary | ICD-10-CM | POA: Diagnosis not present

## 2018-12-14 DIAGNOSIS — R69 Illness, unspecified: Secondary | ICD-10-CM | POA: Diagnosis not present

## 2018-12-18 ENCOUNTER — Encounter: Payer: Self-pay | Admitting: Family Medicine

## 2019-01-10 DIAGNOSIS — Z85828 Personal history of other malignant neoplasm of skin: Secondary | ICD-10-CM | POA: Diagnosis not present

## 2019-01-10 DIAGNOSIS — L821 Other seborrheic keratosis: Secondary | ICD-10-CM | POA: Diagnosis not present

## 2019-01-10 DIAGNOSIS — D3617 Benign neoplasm of peripheral nerves and autonomic nervous system of trunk, unspecified: Secondary | ICD-10-CM | POA: Diagnosis not present

## 2019-01-10 DIAGNOSIS — D485 Neoplasm of uncertain behavior of skin: Secondary | ICD-10-CM | POA: Diagnosis not present

## 2019-01-14 DIAGNOSIS — N401 Enlarged prostate with lower urinary tract symptoms: Secondary | ICD-10-CM | POA: Diagnosis not present

## 2019-01-14 DIAGNOSIS — R972 Elevated prostate specific antigen [PSA]: Secondary | ICD-10-CM | POA: Diagnosis not present

## 2019-01-14 DIAGNOSIS — R351 Nocturia: Secondary | ICD-10-CM | POA: Diagnosis not present

## 2019-02-09 DIAGNOSIS — D1801 Hemangioma of skin and subcutaneous tissue: Secondary | ICD-10-CM | POA: Diagnosis not present

## 2019-02-09 DIAGNOSIS — D2271 Melanocytic nevi of right lower limb, including hip: Secondary | ICD-10-CM | POA: Diagnosis not present

## 2019-02-09 DIAGNOSIS — L570 Actinic keratosis: Secondary | ICD-10-CM | POA: Diagnosis not present

## 2019-02-09 DIAGNOSIS — D2261 Melanocytic nevi of right upper limb, including shoulder: Secondary | ICD-10-CM | POA: Diagnosis not present

## 2019-02-09 DIAGNOSIS — Z85828 Personal history of other malignant neoplasm of skin: Secondary | ICD-10-CM | POA: Diagnosis not present

## 2019-02-09 DIAGNOSIS — L821 Other seborrheic keratosis: Secondary | ICD-10-CM | POA: Diagnosis not present

## 2019-02-09 DIAGNOSIS — D225 Melanocytic nevi of trunk: Secondary | ICD-10-CM | POA: Diagnosis not present

## 2019-03-01 ENCOUNTER — Other Ambulatory Visit: Payer: Self-pay

## 2019-03-02 ENCOUNTER — Other Ambulatory Visit: Payer: Medicare HMO

## 2019-03-02 ENCOUNTER — Encounter: Payer: Self-pay | Admitting: Family Medicine

## 2019-03-02 ENCOUNTER — Ambulatory Visit (INDEPENDENT_AMBULATORY_CARE_PROVIDER_SITE_OTHER): Payer: Medicare HMO

## 2019-03-02 ENCOUNTER — Ambulatory Visit (INDEPENDENT_AMBULATORY_CARE_PROVIDER_SITE_OTHER): Payer: Medicare HMO | Admitting: Family Medicine

## 2019-03-02 ENCOUNTER — Other Ambulatory Visit: Payer: Self-pay

## 2019-03-02 VITALS — BP 136/80 | HR 64 | Temp 98.2°F | Ht 65.0 in | Wt 186.1 lb

## 2019-03-02 DIAGNOSIS — M542 Cervicalgia: Secondary | ICD-10-CM

## 2019-03-02 NOTE — Patient Instructions (Signed)
Set up/schedule neck X-ray at [facility]  Consider daily OTC Flonase for postnasal drip.  Could also take daily Claritan or Zyrtec

## 2019-03-02 NOTE — Progress Notes (Signed)
  Subjective:     Patient ID: Joel [name], male   DOB: [birth year], 73 y.o.   MRN: [id number]  HPI   Joel [name] is seen with some intermittent neck symptoms since about May.  He has some intermittent pain and stiffness which is mostly in the lower cervical region.  Denies any injury.  No radiculitis symptoms.  We did virtual visit back in May.  He tried some topical things like heat and topical rubs with some improvement.  His pain is worse with flexion and rotation to the right or left.  Pain seems to be near the center.  No upper extremity numbness or weakness  Past Medical History:  Diagnosis Date  . BENIGN PROSTATIC HYPERTROPHY 05/07/2009  . COLONIC POLYPS, HX OF 05/07/2009  . HYPERLIPIDEMIA 05/07/2009  . HYPERTENSION 05/07/2009   Past Surgical History:  Procedure Laterality Date  . HAND TENDON SURGERY     Left thumb  . NASAL SINUS SURGERY    . NM RENAL LASIX ([place] HX)      reports that he has never smoked. He has never used smokeless tobacco. He reports current alcohol use. He reports that he does not use drugs. family history includes Hyperlipidemia in his father and mother; Hypertension in his father and mother; Stroke in his father and mother. No Known Allergies   Review of Systems  Constitutional: Negative for appetite change, chills, fever and unexpected weight change.  Cardiovascular: Negative for chest pain.  Musculoskeletal: Positive for neck pain.  Neurological: Negative for weakness, numbness and headaches.       Objective:   Physical Exam Vitals reviewed.  Constitutional:      Appearance: Normal appearance.  Neck:     Comments: He has some pain with extreme neck flexion.  He can only rotate about 45 degrees to the right or left. Cardiovascular:     Rate and Rhythm: Normal rate and regular rhythm.  Pulmonary:     Effort: Pulmonary effort is normal.     Breath sounds: Normal breath sounds.  Neurological:     Mental Status: He is alert.     Comments: Trace  reflexes upper extremities bilaterally.  He has full strength upper extremities throughout        Assessment:     Several month history of intermittent cervical neck pain and stiffness.  Suspect degenerative spondylosis.  Nonfocal neuro exam.  Not describing any radiculitis symptoms    Plan:     -Continue Tylenol as needed -We will try to avoid nonsteroidals or muscle relaxers because of risk of side effects given his age -Given duration recommend cervical spine films to further assess -Consider trial of physical therapy if symptoms persist  Eulas Post MD [facility]

## 2019-03-06 NOTE — Progress Notes (Signed)
I have discussed results of cervical spine with patient.  He has significant degenerative spondylosis- as expected.  We had discussed referral for PT trial and wishes to proceed in that direction.  Will set up.

## 2019-03-17 ENCOUNTER — Encounter: Payer: Self-pay | Admitting: Physical Therapy

## 2019-03-17 ENCOUNTER — Other Ambulatory Visit: Payer: Self-pay

## 2019-03-17 ENCOUNTER — Ambulatory Visit: Payer: Medicare HMO | Attending: Family Medicine | Admitting: Physical Therapy

## 2019-03-17 DIAGNOSIS — M542 Cervicalgia: Secondary | ICD-10-CM | POA: Diagnosis not present

## 2019-03-17 DIAGNOSIS — R293 Abnormal posture: Secondary | ICD-10-CM | POA: Diagnosis not present

## 2019-03-17 NOTE — Therapy (Signed)
[facility] [address] [place], [place], [address] Phone: [phone]   Fax:  [phone]  Physical Therapy Evaluation  Patient Details  Name: Joel [name] MRN: [id number] Date of Birth: 1946/01/05 Referring Provider (PT): Carolann Littler, MD   Encounter Date: 03/17/2019  PT End of Session - 03/17/19 1727    Visit Number  1    PT Start Time  [id number]    PT Stop Time  [id number]    PT Time Calculation (min)  43 min    Activity Tolerance  No increased pain;Patient tolerated treatment well    Behavior During Therapy  [facility] for tasks assessed/performed       Past Medical History:  Diagnosis Date  . BENIGN PROSTATIC HYPERTROPHY 05/07/2009  . COLONIC POLYPS, HX OF 05/07/2009  . HYPERLIPIDEMIA 05/07/2009  . HYPERTENSION 05/07/2009    Past Surgical History:  Procedure Laterality Date  . HAND TENDON SURGERY     Left thumb  . NASAL SINUS SURGERY    . NM RENAL LASIX ([place] HX)      There were no vitals filed for this visit.   Subjective Assessment - 03/17/19 1536    Subjective  Pt states that his neck pain/stiffness begain back in May. He noticed it while gardening and it seemed to flare up from there. He didn't have much issue for several months until December when it flared up again. It seemed to last longer, so he saw the Dr. His xray revealed severe arthritis in the neck. This past week has been better. He denies numbness/tingling.    Limitations  Reading    Patient Stated Goals  He would like a home program to work on independently    Currently in Pain?  No/denies         [facility] PT Assessment - 03/17/19 0001      Assessment   Medical Diagnosis  neck pain     Referring Provider (PT)  Carolann Littler, MD    Onset Date/Surgical Date  --   May 2020   Next MD Visit  none for now      Precautions   Precautions  None      Balance Screen   Has the patient fallen in the past 6 months  No    Has the patient had a  decrease in activity level because of a fear of falling?   No    Is the patient reluctant to leave their home because of a fear of falling?   No      Cognition   Overall Cognitive Status  Within Functional Limits for tasks assessed      Sensation   Additional Comments  denies numbness/tingling       Posture/Postural Control   Posture Comments  forward head, rounded shoulders       ROM / Strength   AROM / PROM / Strength  AROM;Strength      AROM   Overall AROM Comments  passive ROM limited all directions but pain free     AROM Assessment Site  Cervical    Cervical Extension  25    Cervical - Right Rotation  45    Cervical - Left Rotation  50      Strength   Overall Strength Comments  BUE WFL      Palpation   Spinal mobility  hypomobile cervical spine, upper thoracic spine  Objective measurements completed on examination: See above findings.      [place] [facility] PT Treatment/Exercise - 03/17/19 0001      Exercises   Exercises  Neck      Neck Exercises: Seated   Cervical Rotation  Both;5 reps    Cervical Rotation Limitations  SNAG with towel, HEP demo     Other Seated Exercise  cervical extension SNAG x5 reps HEP demo     Other Seated Exercise  thoracic/lumbar extension over foam roll 3x5 reps HEP demo       Neck Exercises: Supine   Neck Retraction  5 reps;3 secs    Neck Retraction Limitations  HEP demo, towel roll behind neck       Neck Exercises: Sidelying   Other Sidelying Exercise  thoracic open book stretch x6 reps each HEP demo              PT Education - 03/17/19 1726    Education Details  eval findings; implemented HEP with review of technique    Person(s) Educated  Patient    Methods  Explanation;Verbal cues;Handout    Comprehension  Verbalized understanding;Returned demonstration       PT Short Term Goals - 03/17/19 1728      PT SHORT TERM GOAL #1   Title  Pt will demo understanding of his HEP to allow for self  mangement of cervical stiffness at home.    Time  1    Period  Days    Status  Achieved                Plan - 03/17/19 1732    Clinical Impression Statement  Pt is a pleasant 74 y.o M referred to OPPT with complaints of initial cervical pain/stiffness in May that resolved until December. He was referred to OPPT and has been working on several exercises on his own without complications. At the time of today's evaluation, pt has not been having pain for the past week or so. He presents with limited active cervical ROM, without pain. In addition, pt has rounded shoulders and increased thoracic kyphosis also contributing to his forward head posture. Pt feels that he can work on a program independently and requested 1 PT visit to establish a HEP. PT reviewed this with him, and he demonstrated proper technique and understanding by the end of the session. Pt was encouraged to reach out to our clinic if he has any further issues/concerns.    Personal Factors and Comorbidities  Age;Time since onset of injury/illness/exacerbation    Stability/Clinical Decision Making  Stable/Uncomplicated    Clinical Decision Making  Low    Rehab Potential  Good    PT Frequency  One time visit    PT Treatment/Interventions  ADLs/Self Care Home Management;Moist Heat;Therapeutic exercise;Patient/family education    PT Home Exercise Plan  [id number]    Recommended Other Services  none    Consulted and Agree with Plan of Care  Patient       Patient will benefit from skilled therapeutic intervention in order to improve the following deficits and impairments:  Hypomobility, Decreased range of motion, Decreased mobility  Visit Diagnosis: Cervicalgia  Abnormal posture     Problem List Patient Active Problem List   Diagnosis Date Noted  . Elevated PSA 10/27/2013  . Dyslipidemia 05/07/2009  . Essential hypertension 05/07/2009  . BPH (benign prostatic hyperplasia) 05/07/2009  . History of colonic polyps  05/07/2009    5:40 PM,03/17/19 Sherol Dade PT, DPT [facility] [place]  [place] [address], [place], [place], [address] Phone: [phone]   Fax:  [phone]  Name: Joel [name] MRN: [id number] Date of Birth: [birth year]

## 2019-03-31 DIAGNOSIS — R3915 Urgency of urination: Secondary | ICD-10-CM | POA: Diagnosis not present

## 2019-03-31 DIAGNOSIS — R972 Elevated prostate specific antigen [PSA]: Secondary | ICD-10-CM | POA: Diagnosis not present

## 2019-04-06 DIAGNOSIS — R69 Illness, unspecified: Secondary | ICD-10-CM | POA: Diagnosis not present

## 2019-06-27 DIAGNOSIS — N401 Enlarged prostate with lower urinary tract symptoms: Secondary | ICD-10-CM | POA: Diagnosis not present

## 2019-06-27 DIAGNOSIS — R3915 Urgency of urination: Secondary | ICD-10-CM | POA: Diagnosis not present

## 2019-06-27 DIAGNOSIS — R972 Elevated prostate specific antigen [PSA]: Secondary | ICD-10-CM | POA: Diagnosis not present

## 2019-07-05 ENCOUNTER — Other Ambulatory Visit: Payer: Self-pay

## 2019-07-05 NOTE — Progress Notes (Signed)
Joel [name] DOB: Mar 28, 1945 Encounter date: 07/06/2019  This is a 74 y.o. male who presents for complete physical   History of present illness/Additional concerns: No specific concerns today.  HTN: amlodipine 10mg  - does check at home- states that usually more in 130's/75-80. No low pressures.   Colonoscopy: last 05/2017 due for repeat in 2024 (per patient)  Follows with urology for hx BPH/elevated PSA. Saw them about a week ago. Has seen Dr. Diona Fanti and Dr. Tresa Moore. They did do bloodwork and did check PSA last visit. Referred to Dr. Tresa Moore for robotic prostate surgery; but had good improvement with medication so elected not to proceed with surgery.  Derm: Dr. Martinique  Past Medical History:  Diagnosis Date  . BENIGN PROSTATIC HYPERTROPHY 05/07/2009  . COLONIC POLYPS, HX OF 05/07/2009  . HYPERLIPIDEMIA 05/07/2009  . HYPERTENSION 05/07/2009   Past Surgical History:  Procedure Laterality Date  . HAND TENDON SURGERY     Left thumb  . NASAL SINUS SURGERY    . NM RENAL LASIX ([facility] HX)     No Known Allergies Current Meds  Medication Sig  . amLODipine (NORVASC) 10 MG tablet TAKE 1 TABLET BY MOUTH EVERY DAY  . aspirin 81 MG tablet Take 81 mg by mouth daily.    Marland Kitchen CALCIUM PO Take by mouth.  . cholecalciferol (VITAMIN D3) 25 MCG (1000 UNIT) tablet Take 1,000 Units by mouth daily.  Marland Kitchen CINNAMON PO Take by mouth.  . co-enzyme Q-10 30 MG capsule Take 30 mg by mouth 3 (three) times daily.  . Multiple Vitamin (MULTIVITAMIN) capsule Take 1 capsule by mouth daily.  . Omega 3-6-9 Fatty Acids (OMEGA-3-6-9 PO) Take by mouth.  . Red Yeast Rice Extract (RED YEAST RICE PO) Take by mouth.  . tamsulosin (FLOMAX) 0.4 MG CAPS capsule Take 1 capsule (0.4 mg total) by mouth daily. (Patient taking differently: Take 0.8 mg by mouth daily. )   Social History   Tobacco Use  . Smoking status: Never Smoker  . Smokeless tobacco: Never Used  Substance Use Topics  . Alcohol use: Yes   Family History  Problem  Relation Age of Onset  . Hyperlipidemia Mother   . Hypertension Mother   . Stroke Mother   . Hyperlipidemia Father   . Hypertension Father   . Stroke Father      Review of Systems  Constitutional: Negative for activity change, appetite change, chills, fatigue, fever and unexpected weight change.  HENT: Negative for congestion, ear pain, hearing loss, sinus pressure, sinus pain, sore throat and trouble swallowing.   Eyes: Negative for pain and visual disturbance.  Respiratory: Negative for cough, chest tightness, shortness of breath and wheezing.   Cardiovascular: Negative for chest pain, palpitations and leg swelling.  Gastrointestinal: Negative for abdominal distention, abdominal pain, blood in stool, constipation, diarrhea, nausea and vomiting.  Genitourinary: Negative for decreased urine volume, difficulty urinating, dysuria, penile pain and testicular pain.  Musculoskeletal: Negative for arthralgias, back pain and joint swelling.  Skin: Negative for rash.  Neurological: Negative for dizziness, weakness, numbness and headaches.  Hematological: Negative for adenopathy. Does not bruise/bleed easily.  Psychiatric/Behavioral: Negative for agitation, sleep disturbance and suicidal ideas. The patient is not nervous/anxious.     CBC:  Lab Results  Component Value Date   WBC 8.3 04/21/2018   HGB 14.7 04/21/2018   HCT 43.1 04/21/2018   MCH 30.4 09/05/2014   MCHC 34.1 04/21/2018   RDW 13.2 04/21/2018   PLT 233.0 04/21/2018   CMP: Lab  Results  Component Value Date   NA 139 04/21/2018   K 4.3 04/21/2018   CL 102 04/21/2018   CO2 29 04/21/2018   ANIONGAP 7 09/05/2014   GLUCOSE 87 04/21/2018   BUN 18 04/21/2018   CREATININE 0.98 04/21/2018   GFRAA >60 09/05/2014   CALCIUM 9.6 04/21/2018   PROT 7.0 04/21/2018   BILITOT 0.6 04/21/2018   ALKPHOS 72 04/21/2018   ALT 24 04/21/2018   AST 24 04/21/2018   LIPID: Lab Results  Component Value Date   CHOL 198 04/21/2018   TRIG  91.0 04/21/2018   HDL 64.40 04/21/2018   LDLCALC 116 (H) 04/21/2018    Objective:  BP 118/80 (BP Location: Left Arm, Patient Position: Sitting, Cuff Size: Normal)   Pulse 68   Temp 97.8 F (36.6 C) (Temporal)   Ht 5\' 5"  (1.651 m)   Wt 188 lb 1.6 oz (85.3 kg)   BMI 31.30 kg/m   Weight: 188 lb 1.6 oz (85.3 kg)   BP Readings from Last 3 Encounters:  07/06/19 118/80  03/02/19 136/80  04/21/18 140/84   Wt Readings from Last 3 Encounters:  07/06/19 188 lb 1.6 oz (85.3 kg)  03/02/19 186 lb 1.6 oz (84.4 kg)  04/21/18 191 lb (86.6 kg)    Physical Exam Constitutional:      General: He is not in acute distress.    Appearance: He is well-developed.  HENT:     Head: Normocephalic and atraumatic.     Right Ear: External ear normal.     Left Ear: External ear normal.     Nose: Nose normal.     Mouth/Throat:     Pharynx: No oropharyngeal exudate.  Eyes:     Conjunctiva/sclera: Conjunctivae normal.     Pupils: Pupils are equal, round, and reactive to light.  Neck:     Thyroid: No thyromegaly.  Cardiovascular:     Rate and Rhythm: Normal rate and regular rhythm.     Heart sounds: Normal heart sounds. No murmur. No friction rub. No gallop.   Pulmonary:     Effort: Pulmonary effort is normal. No respiratory distress.     Breath sounds: Normal breath sounds. No stridor. No wheezing or rales.  Abdominal:     General: Bowel sounds are normal.     Palpations: Abdomen is soft.  Musculoskeletal:        General: Normal range of motion.     Cervical back: Neck supple.  Skin:    General: Skin is warm and dry.  Neurological:     Mental Status: He is alert and oriented to person, place, and time.  Psychiatric:        Behavior: Behavior normal.        Thought Content: Thought content normal.        Judgment: Judgment normal.     Assessment/Plan: There are no preventive care reminders to display for this patient.   Health Maintenance reviewed.   1. Preventative health  care Keep up with active and healthy lifestyle.  2. Essential hypertension Well controlled. Continue current medication. - CBC with Differential/Platelet; Future - Comprehensive metabolic panel; Future  3. Dyslipidemia Has been diet controlled - Lipid panel; Future  4. Decreased hearing of both ears Will let me know if wanting to pursue further testing.   5. Decreased energy - just feels like stamina is not quite what it used to be overall. Wondering about supplements/testosterone.   Return in about 1 year (around 07/05/2020) for physical  exam.  Micheline Rough, MD

## 2019-07-06 ENCOUNTER — Encounter: Payer: Self-pay | Admitting: Family Medicine

## 2019-07-06 ENCOUNTER — Ambulatory Visit (INDEPENDENT_AMBULATORY_CARE_PROVIDER_SITE_OTHER): Payer: Medicare HMO | Admitting: Family Medicine

## 2019-07-06 VITALS — BP 118/80 | HR 68 | Temp 97.8°F | Ht 65.0 in | Wt 188.1 lb

## 2019-07-06 DIAGNOSIS — H9193 Unspecified hearing loss, bilateral: Secondary | ICD-10-CM | POA: Diagnosis not present

## 2019-07-06 DIAGNOSIS — Z0000 Encounter for general adult medical examination without abnormal findings: Secondary | ICD-10-CM

## 2019-07-06 DIAGNOSIS — E559 Vitamin D deficiency, unspecified: Secondary | ICD-10-CM

## 2019-07-06 DIAGNOSIS — E538 Deficiency of other specified B group vitamins: Secondary | ICD-10-CM

## 2019-07-06 DIAGNOSIS — I10 Essential (primary) hypertension: Secondary | ICD-10-CM

## 2019-07-06 DIAGNOSIS — R5383 Other fatigue: Secondary | ICD-10-CM

## 2019-07-06 DIAGNOSIS — E785 Hyperlipidemia, unspecified: Secondary | ICD-10-CM

## 2019-07-06 LAB — COMPREHENSIVE METABOLIC PANEL
ALT: 23 U/L (ref 0–53)
AST: 23 U/L (ref 0–37)
Albumin: 4.3 g/dL (ref 3.5–5.2)
Alkaline Phosphatase: 74 U/L (ref 39–117)
BUN: 24 mg/dL — ABNORMAL HIGH (ref 6–23)
CO2: 31 mEq/L (ref 19–32)
Calcium: 9.6 mg/dL (ref 8.4–10.5)
Chloride: 104 mEq/L (ref 96–112)
Creatinine, Ser: 1.09 mg/dL (ref 0.40–1.50)
GFR: 66.31 mL/min (ref >60.00)
Glucose, Bld: 99 mg/dL (ref 70–99)
Potassium: 4.5 mEq/L (ref 3.5–5.1)
Sodium: 141 mEq/L (ref 135–145)
Total Bilirubin: 0.5 mg/dL (ref 0.2–1.2)
Total Protein: 6.7 g/dL (ref 6.0–8.3)

## 2019-07-06 LAB — LIPID PANEL
Cholesterol: 215 mg/dL — ABNORMAL HIGH (ref 0–200)
HDL: 68.8 mg/dL (ref >39.00)
LDL Cholesterol: 131 mg/dL — ABNORMAL HIGH (ref 0–99)
NonHDL: 146.18
Total CHOL/HDL Ratio: 3
Triglycerides: 77 mg/dL (ref 0.0–149.0)
VLDL: 15.4 mg/dL (ref 0.0–40.0)

## 2019-07-06 LAB — CBC WITH DIFFERENTIAL/PLATELET
Basophils Absolute: 0.1 10*3/uL (ref 0.0–0.1)
Basophils Relative: 1.2 % (ref 0.0–3.0)
Eosinophils Absolute: 0.4 10*3/uL (ref 0.0–0.7)
Eosinophils Relative: 6 % — ABNORMAL HIGH (ref 0.0–5.0)
HCT: 42.1 % (ref 39.0–52.0)
Hemoglobin: 14.4 g/dL (ref 13.0–17.0)
Lymphocytes Relative: 27.5 % (ref 12.0–46.0)
Lymphs Abs: 1.8 10*3/uL (ref 0.7–4.0)
MCHC: 34.1 g/dL (ref 30.0–36.0)
MCV: 90.7 fl (ref 78.0–100.0)
Monocytes Absolute: 0.5 10*3/uL (ref 0.1–1.0)
Monocytes Relative: 8 % (ref 3.0–12.0)
Neutro Abs: 3.7 10*3/uL (ref 1.4–7.7)
Neutrophils Relative %: 57.3 % (ref 43.0–77.0)
Platelets: 223 10*3/uL (ref 150.0–400.0)
RBC: 4.64 Mil/uL (ref 4.22–5.81)
RDW: 13.6 % (ref 11.5–15.5)
WBC: 6.5 10*3/uL (ref 4.0–10.5)

## 2019-07-06 LAB — TESTOSTERONE: Testosterone: 322.18 ng/dL (ref 300.00–890.00)

## 2019-07-06 LAB — TSH: TSH: 1.1 u[IU]/mL (ref 0.35–4.50)

## 2019-07-06 LAB — VITAMIN B12: Vitamin B-12: 501 pg/mL (ref 211–911)

## 2019-07-06 LAB — VITAMIN D 25 HYDROXY (VIT D DEFICIENCY, FRACTURES): VITD: 39.9 ng/mL (ref 30.00–100.00)

## 2019-07-06 NOTE — Addendum Note (Signed)
Addended by: Elmer Picker on: 07/06/2019 08:34 AM   Modules accepted: Orders

## 2019-09-07 ENCOUNTER — Other Ambulatory Visit: Payer: Self-pay | Admitting: Family Medicine

## 2019-10-05 DIAGNOSIS — R69 Illness, unspecified: Secondary | ICD-10-CM | POA: Diagnosis not present

## 2019-10-13 DIAGNOSIS — H5203 Hypermetropia, bilateral: Secondary | ICD-10-CM | POA: Diagnosis not present

## 2019-10-13 DIAGNOSIS — H2513 Age-related nuclear cataract, bilateral: Secondary | ICD-10-CM | POA: Diagnosis not present

## 2019-10-13 DIAGNOSIS — H52203 Unspecified astigmatism, bilateral: Secondary | ICD-10-CM | POA: Diagnosis not present

## 2019-10-13 DIAGNOSIS — H524 Presbyopia: Secondary | ICD-10-CM | POA: Diagnosis not present

## 2019-10-26 ENCOUNTER — Other Ambulatory Visit: Payer: Self-pay

## 2019-10-26 ENCOUNTER — Ambulatory Visit (INDEPENDENT_AMBULATORY_CARE_PROVIDER_SITE_OTHER): Payer: Medicare HMO

## 2019-10-26 DIAGNOSIS — Z0000 Encounter for general adult medical examination without abnormal findings: Secondary | ICD-10-CM

## 2019-10-26 NOTE — Patient Instructions (Addendum)
[name] , Thank you for taking time to come for your Medicare Wellness Visit. I appreciate your ongoing commitment to your health goals. Please review the following plan we discussed and let me know if I can assist you in the future.   Screening recommendations/referrals: Colonoscopy: Done 05/15/17 Recommended yearly ophthalmology/optometry visit for glaucoma screening and checkup Recommended yearly dental visit for hygiene and checkup  Vaccinations: Influenza vaccine: Up to date Pneumococcal vaccine: Up to date Tdap vaccine: Due and discussed Shingles vaccine: Completed 7/15 & 12/14/18   Covid-19: Completed 1/28 & 05/05/19  Advanced directives: Please bring a copy of your health care power of attorney and living will to the office at your convenience.   Conditions/risks identified: Lose a couple pounds   Next appointment: Follow up in one year for your annual wellness visit.   Preventive Care 1 Years and Older, Male Preventive care refers to lifestyle choices and visits with your health care provider that can promote health and wellness. What does preventive care include?  A yearly physical exam. This is also called an annual well check.  Dental exams once or twice a year.  Routine eye exams. Ask your health care provider how often you should have your eyes checked.  Personal lifestyle choices, including:  Daily care of your teeth and gums.  Regular physical activity.  Eating a healthy diet.  Avoiding tobacco and drug use.  Limiting alcohol use.  Practicing safe sex.  Taking low doses of aspirin every day.  Taking vitamin and mineral supplements as recommended by your health care provider. What happens during an annual well check? The services and screenings done by your health care provider during your annual well check will depend on your age, overall health, lifestyle risk factors, and family history of disease. Counseling  Your health care provider may ask you  questions about your:  Alcohol use.  Tobacco use.  Drug use.  Emotional well-being.  Home and relationship well-being.  Sexual activity.  Eating habits.  History of falls.  Memory and ability to understand (cognition).  Work and work Statistician. Screening  You may have the following tests or measurements:  Height, weight, and BMI.  Blood pressure.  Lipid and cholesterol levels. These may be checked every 5 years, or more frequently if you are over 27 years old.  Skin check.  Lung cancer screening. You may have this screening every year starting at age 58 if you have a 30-pack-year history of smoking and currently smoke or have quit within the past 15 years.  Fecal occult blood test (FOBT) of the stool. You may have this test every year starting at age 23.  Flexible sigmoidoscopy or colonoscopy. You may have a sigmoidoscopy every 5 years or a colonoscopy every 10 years starting at age 35.  Prostate cancer screening. Recommendations will vary depending on your family history and other risks.  Hepatitis C blood test.  Hepatitis B blood test.  Sexually transmitted disease (STD) testing.  Diabetes screening. This is done by checking your blood sugar (glucose) after you have not eaten for a while (fasting). You may have this done every 1-3 years.  Abdominal aortic aneurysm (AAA) screening. You may need this if you are a current or former smoker.  Osteoporosis. You may be screened starting at age 75 if you are at high risk. Talk with your health care provider about your test results, treatment options, and if necessary, the need for more tests. Vaccines  Your health care provider may  recommend certain vaccines, such as:  Influenza vaccine. This is recommended every year.  Tetanus, diphtheria, and acellular pertussis (Tdap, Td) vaccine. You may need a Td booster every 10 years.  Zoster vaccine. You may need this after age 26.  Pneumococcal 13-valent conjugate  (PCV13) vaccine. One dose is recommended after age 59.  Pneumococcal polysaccharide (PPSV23) vaccine. One dose is recommended after age 61. Talk to your health care provider about which screenings and vaccines you need and how often you need them. This information is not intended to replace advice given to you by your health care provider. Make sure you discuss any questions you have with your health care provider. Document Released: 03/23/2015 Document Revised: 11/14/2015 Document Reviewed: 12/26/2014 Elsevier Interactive Patient Education  2017 [place] Prevention in the Home Falls can cause injuries. They can happen to people of all ages. There are many things you can do to make your home safe and to help prevent falls. What can I do on the outside of my home?  Regularly fix the edges of walkways and driveways and fix any cracks.  Remove anything that might make you trip as you walk through a door, such as a raised step or threshold.  Trim any bushes or trees on the path to your home.  Use bright outdoor lighting.  Clear any walking paths of anything that might make someone trip, such as rocks or tools.  Regularly check to see if handrails are loose or broken. Make sure that both sides of any steps have handrails.  Any raised decks and porches should have guardrails on the edges.  Have any leaves, snow, or ice cleared regularly.  Use sand or salt on walking paths during winter.  Clean up any spills in your garage right away. This includes oil or grease spills. What can I do in the bathroom?  Use night lights.  Install grab bars by the toilet and in the tub and shower. Do not use towel bars as grab bars.  Use non-skid mats or decals in the tub or shower.  If you need to sit down in the shower, use a plastic, non-slip stool.  Keep the floor dry. Clean up any water that spills on the floor as soon as it happens.  Remove soap buildup in the tub or shower  regularly.  Attach bath mats securely with double-sided non-slip rug tape.  Do not have throw rugs and other things on the floor that can make you trip. What can I do in the bedroom?  Use night lights.  Make sure that you have a light by your bed that is easy to reach.  Do not use any sheets or blankets that are too big for your bed. They should not hang down onto the floor.  Have a firm chair that has side arms. You can use this for support while you get dressed.  Do not have throw rugs and other things on the floor that can make you trip. What can I do in the kitchen?  Clean up any spills right away.  Avoid walking on wet floors.  Keep items that you use a lot in easy-to-reach places.  If you need to reach something above you, use a strong step stool that has a grab bar.  Keep electrical cords out of the way.  Do not use floor polish or wax that makes floors slippery. If you must use wax, use non-skid floor wax.  Do not have throw rugs and  other things on the floor that can make you trip. What can I do with my stairs?  Do not leave any items on the stairs.  Make sure that there are handrails on both sides of the stairs and use them. Fix handrails that are broken or loose. Make sure that handrails are as long as the stairways.  Check any carpeting to make sure that it is firmly attached to the stairs. Fix any carpet that is loose or worn.  Avoid having throw rugs at the top or bottom of the stairs. If you do have throw rugs, attach them to the floor with carpet tape.  Make sure that you have a light switch at the top of the stairs and the bottom of the stairs. If you do not have them, ask someone to add them for you. What else can I do to help prevent falls?  Wear shoes that:  Do not have high heels.  Have rubber bottoms.  Are comfortable and fit you well.  Are closed at the toe. Do not wear sandals.  If you use a stepladder:  Make sure that it is fully  opened. Do not climb a closed stepladder.  Make sure that both sides of the stepladder are locked into place.  Ask someone to hold it for you, if possible.  Clearly mark and make sure that you can see:  Any grab bars or handrails.  First and last steps.  Where the edge of each step is.  Use tools that help you move around (mobility aids) if they are needed. These include:  Canes.  Walkers.  Scooters.  Crutches.  Turn on the lights when you go into a dark area. Replace any light bulbs as soon as they burn out.  Set up your furniture so you have a clear path. Avoid moving your furniture around.  If any of your floors are uneven, fix them.  If there are any pets around you, be aware of where they are.  Review your medicines with your doctor. Some medicines can make you feel dizzy. This can increase your chance of falling. Ask your doctor what other things that you can do to help prevent falls. This information is not intended to replace advice given to you by your health care provider. Make sure you discuss any questions you have with your health care provider. Document Released: 12/21/2008 Document Revised: 08/02/2015 Document Reviewed: 03/31/2014 Elsevier Interactive Patient Education  2017 [employer].

## 2019-10-26 NOTE — Progress Notes (Signed)
Virtual Visit via Telephone Note  I connected with  Joel [name] on 10/26/19 at  3:15 PM EDT by telephone and verified that I am speaking with the correct person using two identifiers.  Medicare Annual Wellness visit completed telephonically due to Covid-19 pandemic.   Persons participating in this call: This Health Coach and this patient.   Location: Patient: Home Provider: Office   I discussed the limitations, risks, security and privacy concerns of performing an evaluation and management service by telephone and the availability of in person appointments. The patient expressed understanding and agreed to proceed.  Unable to perform video visit due to video visit attempted and failed and/or patient does not have video capability.   Some vital signs may be absent or patient reported.   Willette Brace, LPN    Subjective:   Joel [name] is a 74 y.o. male who presents for Medicare Annual/Subsequent preventive examination.  Review of Systems     Cardiac Risk Factors include: male gender;hypertension;dyslipidemia;obesity (BMI >30kg/m2)     Objective:    There were no vitals filed for this visit. There is no height or weight on file to calculate BMI.  Advanced Directives 10/26/2019 03/17/2019 09/05/2014  Does Patient Have a Medical Advance Directive? Yes Yes No  Type of Paramedic of [place];Living will [place];Living will -  Does patient want to make changes to medical advance directive? - No - Patient declined -  Copy of [place] in Chart? No - copy requested No - copy requested -  Would patient like information on creating a medical advance directive? - - No - patient declined information    Current Medications (verified) Outpatient Encounter Medications as of 10/26/2019  Medication Sig  . amLODipine (NORVASC) 10 MG tablet TAKE 1 TABLET BY MOUTH EVERY DAY  . aspirin 81 MG tablet Take 81 mg by mouth  daily.    Marland Kitchen CALCIUM PO Take by mouth.  . cholecalciferol (VITAMIN D3) 25 MCG (1000 UNIT) tablet Take 1,000 Units by mouth daily.  Marland Kitchen CINNAMON PO Take by mouth.  . co-enzyme Q-10 30 MG capsule Take 30 mg by mouth 3 (three) times daily.  . finasteride (PROSCAR) 5 MG tablet Take 5 mg by mouth daily.  . Multiple Vitamin (MULTIVITAMIN) capsule Take 1 capsule by mouth daily.  . Omega 3-6-9 Fatty Acids (OMEGA-3-6-9 PO) Take by mouth.  . Red Yeast Rice Extract (RED YEAST RICE PO) Take by mouth.  . tamsulosin (FLOMAX) 0.4 MG CAPS capsule Take 1 capsule (0.4 mg total) by mouth daily. (Patient taking differently: Take 0.8 mg by mouth daily. )   No facility-administered encounter medications on file as of 10/26/2019.    Allergies (verified) Patient has no known allergies.   History: Past Medical History:  Diagnosis Date  . BENIGN PROSTATIC HYPERTROPHY 05/07/2009  . COLONIC POLYPS, HX OF 05/07/2009  . HYPERLIPIDEMIA 05/07/2009  . HYPERTENSION 05/07/2009   Past Surgical History:  Procedure Laterality Date  . HAND TENDON SURGERY     Left thumb  . NASAL SINUS SURGERY    . NM RENAL LASIX ([facility] HX)     Family History  Problem Relation Age of Onset  . Hyperlipidemia Mother   . Hypertension Mother   . Stroke Mother   . Hyperlipidemia Father   . Hypertension Father   . Stroke Father    Social History   Socioeconomic History  . Marital status: Married    Spouse name: Not  on file  . Number of children: Not on file  . Years of education: Not on file  . Highest education level: Not on file  Occupational History  . Occupation: retired  Tobacco Use  . Smoking status: Never Smoker  . Smokeless tobacco: Never Used  Vaping Use  . Vaping Use: Never used  Substance and Sexual Activity  . Alcohol use: Yes  . Drug use: No  . Sexual activity: Not on file  Other Topics Concern  . Not on file  Social History Narrative  . Not on file   Social Determinants of Health   Financial Resource Strain:  Low Risk   . Difficulty of Paying Living Expenses: Not hard at all  Food Insecurity: No Food Insecurity  . Worried About Charity fundraiser in the Last Year: Never true  . Ran Out of Food in the Last Year: Never true  Transportation Needs: No Transportation Needs  . Lack of Transportation (Medical): No  . Lack of Transportation (Non-Medical): No  Physical Activity: Sufficiently Active  . Days of Exercise per Week: 4 days  . Minutes of Exercise per Session: 120 min  Stress: No Stress Concern Present  . Feeling of Stress : Not at all  Social Connections: Moderately Isolated  . Frequency of Communication with Friends and Family: More than three times a week  . Frequency of Social Gatherings with Friends and Family: Three times a week  . Attends Religious Services: Never  . Active Member of Clubs or Organizations: No  . Attends Archivist Meetings: Never  . Marital Status: Married    Tobacco Counseling Counseling given: Not Answered   Clinical Intake:  Pre-visit preparation completed: Yes  Pain : No/denies pain     BMI - recorded: 31.3 Nutritional Status: BMI > 30  Obese Diabetes: No  How often do you need to have someone help you when you read instructions, pamphlets, or other written materials from your doctor or pharmacy?: 1 - Never  Diabetic?No  Interpreter Needed?: No  Information entered by :: Vivien Rossetti, LPN   Activities of Daily Living In your present state of health, do you have any difficulty performing the following activities: 10/26/2019  Hearing? Y  Vision? N  Difficulty concentrating or making decisions? N  Walking or climbing stairs? N  Dressing or bathing? N  Doing errands, shopping? N  Preparing Food and eating ? N  Using the Toilet? N  In the past six months, have you accidently leaked urine? N  Do you have problems with loss of bowel control? N  Managing your Medications? N  Managing your Finances? N  Housekeeping or managing  your Housekeeping? N  Some recent data might be hidden    Patient Care Team: Caren Macadam, MD as PCP - General (Family Medicine) Franchot Gallo, MD as Consulting Physician (Urology) Martinique, Amy, MD as Consulting Physician (Dermatology)  Indicate any recent Medical Services you may have received from other than [facility] providers in the past year (date may be approximate).     Assessment:   This is a routine wellness examination for Joel [name].  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 Patient Stated     Lose a couple pounds      Depression Screen PHQ 2/9 Scores 10/26/2019 07/06/2019 03/24/2016 03/20/2015 10/27/2013  PHQ - 2 Score 0 0 0 0 0    Fall Risk Fall Risk  10/26/2019 07/06/2019 03/24/2016 03/20/2015 10/27/2013  Falls in the past year? 0 0 No No No  Number falls in past yr: 0 0 - - -  Injury with Fall? 0 0 - - -  Follow up Falls prevention discussed - - - -    Any stairs in or around the home? No  If so, are there any without handrails? No  Home free of loose throw rugs in walkways, pet beds, electrical cords, etc? Yes  Adequate lighting in your home to reduce risk of falls? Yes   ASSISTIVE DEVICES UTILIZED TO PREVENT FALLS:  Life alert? No  Use of a cane, walker or w/c? No  Grab bars in the bathroom? No  Shower chair or bench in shower? No  Elevated toilet seat or a handicapped toilet? No   TIMED UP AND GO:  Was the test performed? No .      Cognitive Function:     6CIT Screen 10/26/2019  What  Year? 0 points  What month? 0 points  Count back from 20 0 points  Months in reverse 0 points  Repeat phrase 0 points    Immunizations Immunization History  Administered Date(s) Administered  . Influenza Split 01/01/2011  . Influenza Whole 01/08/2010  . Influenza,inj,Quad PF,6+ Mos 03/20/2015  . Influenza-Unspecified 02/22/2016, 12/14/2018  . PFIZER SARS-COV-2 Vaccination 04/07/2019, 05/05/2019  . Pneumococcal Conjugate-13 10/27/2013  . Pneumococcal Polysaccharide-23 09/24/2012  . Td 01/08/2010  . Zoster 01/08/2010  . Zoster Recombinat (Shingrix) 09/22/2018, 12/14/2018    TDAP status: Due, Education has been provided regarding the importance of this vaccine. Advised may receive this vaccine at local pharmacy or Health Dept. Aware to provide a copy of the vaccination record if obtained from local pharmacy or Health Dept. Verbalized acceptance and understanding. Flu Vaccine status: Up to date Pneumococcal vaccine status: Up to date Covid-19 vaccine status: Completed vaccines  Qualifies for Shingles Vaccine? Yes   Zostavax completed Yes   Shingrix Completed?: Yes  Screening Tests Health Maintenance  Topic Date Due  . INFLUENZA VACCINE  10/09/2019  . TETANUS/TDAP  01/09/2020  . COLONOSCOPY  05/16/2027  . COVID-19 Vaccine  Completed  . Hepatitis C Screening  Completed  . PNA vac Low Risk Adult  Completed    Health Maintenance  Health Maintenance Due  Topic Date Due  . INFLUENZA VACCINE  10/09/2019    Colorectal cancer screening: Completed 05/15/17. Repeat every 10 years    Additional Screening:  Hepatitis C Screening: Completed 03/31/17  Vision Screening: Recommended annual ophthalmology exams for early detection of glaucoma and other disorders of the eye. Is the patient up to date with their annual eye exam?  Yes  Who is the provider or what is the name of the office in which the patient attends annual eye exams? Dr Delman Cheadle  Dental Screening: Recommended annual  dental exams for proper oral hygiene  Community Resource Referral / Chronic Care Management: CRR required this visit?  No   CCM required this visit?  No      Plan:  I have personally reviewed and noted the following in the patient's chart:   . Medical and social history . Use of alcohol, tobacco or illicit drugs  . Current medications and supplements . Functional ability and status . Nutritional status . Physical activity . Advanced directives . List of other physicians . Hospitalizations, surgeries, and ER visits in previous 12 months . Vitals . Screenings to include cognitive, depression, and falls . Referrals and appointments  In addition, I have reviewed and discussed with patient certain preventive protocols, quality metrics, and best practice recommendations. A written personalized care plan for preventive services as well as general preventive health recommendations were provided to patient.     Willette Brace, LPN   5/32/9924   Nurse Notes: None

## 2019-12-13 DIAGNOSIS — R69 Illness, unspecified: Secondary | ICD-10-CM | POA: Diagnosis not present

## 2019-12-26 DIAGNOSIS — L821 Other seborrheic keratosis: Secondary | ICD-10-CM | POA: Diagnosis not present

## 2019-12-26 DIAGNOSIS — L570 Actinic keratosis: Secondary | ICD-10-CM | POA: Diagnosis not present

## 2019-12-26 DIAGNOSIS — L738 Other specified follicular disorders: Secondary | ICD-10-CM | POA: Diagnosis not present

## 2019-12-26 DIAGNOSIS — D225 Melanocytic nevi of trunk: Secondary | ICD-10-CM | POA: Diagnosis not present

## 2019-12-26 DIAGNOSIS — Z85828 Personal history of other malignant neoplasm of skin: Secondary | ICD-10-CM | POA: Diagnosis not present

## 2019-12-26 DIAGNOSIS — D2261 Melanocytic nevi of right upper limb, including shoulder: Secondary | ICD-10-CM | POA: Diagnosis not present

## 2020-02-01 ENCOUNTER — Encounter: Payer: Self-pay | Admitting: Family Medicine

## 2020-04-06 ENCOUNTER — Encounter: Payer: Self-pay | Admitting: Family Medicine

## 2020-06-25 DIAGNOSIS — L570 Actinic keratosis: Secondary | ICD-10-CM | POA: Diagnosis not present

## 2020-06-25 DIAGNOSIS — D3617 Benign neoplasm of peripheral nerves and autonomic nervous system of trunk, unspecified: Secondary | ICD-10-CM | POA: Diagnosis not present

## 2020-06-25 DIAGNOSIS — L738 Other specified follicular disorders: Secondary | ICD-10-CM | POA: Diagnosis not present

## 2020-06-25 DIAGNOSIS — L820 Inflamed seborrheic keratosis: Secondary | ICD-10-CM | POA: Diagnosis not present

## 2020-06-25 DIAGNOSIS — L821 Other seborrheic keratosis: Secondary | ICD-10-CM | POA: Diagnosis not present

## 2020-06-25 DIAGNOSIS — Z85828 Personal history of other malignant neoplasm of skin: Secondary | ICD-10-CM | POA: Diagnosis not present

## 2020-06-25 DIAGNOSIS — D225 Melanocytic nevi of trunk: Secondary | ICD-10-CM | POA: Diagnosis not present

## 2020-06-26 DIAGNOSIS — R972 Elevated prostate specific antigen [PSA]: Secondary | ICD-10-CM | POA: Diagnosis not present

## 2020-06-26 LAB — PSA: PSA: 6.92

## 2020-07-03 DIAGNOSIS — R3915 Urgency of urination: Secondary | ICD-10-CM | POA: Diagnosis not present

## 2020-07-03 DIAGNOSIS — R972 Elevated prostate specific antigen [PSA]: Secondary | ICD-10-CM | POA: Diagnosis not present

## 2020-07-09 ENCOUNTER — Encounter: Payer: Medicare HMO | Admitting: Family Medicine

## 2020-07-10 DIAGNOSIS — Z20822 Contact with and (suspected) exposure to covid-19: Secondary | ICD-10-CM | POA: Diagnosis not present

## 2020-07-18 ENCOUNTER — Encounter: Payer: Medicare HMO | Admitting: Family Medicine

## 2020-07-27 ENCOUNTER — Other Ambulatory Visit: Payer: Self-pay

## 2020-07-30 ENCOUNTER — Encounter: Payer: Self-pay | Admitting: Family Medicine

## 2020-07-30 ENCOUNTER — Ambulatory Visit (INDEPENDENT_AMBULATORY_CARE_PROVIDER_SITE_OTHER): Payer: Medicare HMO | Admitting: Family Medicine

## 2020-07-30 ENCOUNTER — Other Ambulatory Visit: Payer: Self-pay

## 2020-07-30 VITALS — BP 140/78 | HR 63 | Temp 98.1°F | Ht 65.0 in | Wt 192.8 lb

## 2020-07-30 DIAGNOSIS — R351 Nocturia: Secondary | ICD-10-CM

## 2020-07-30 DIAGNOSIS — I10 Essential (primary) hypertension: Secondary | ICD-10-CM

## 2020-07-30 DIAGNOSIS — Z0000 Encounter for general adult medical examination without abnormal findings: Secondary | ICD-10-CM | POA: Diagnosis not present

## 2020-07-30 DIAGNOSIS — N401 Enlarged prostate with lower urinary tract symptoms: Secondary | ICD-10-CM

## 2020-07-30 DIAGNOSIS — H9193 Unspecified hearing loss, bilateral: Secondary | ICD-10-CM

## 2020-07-30 DIAGNOSIS — E785 Hyperlipidemia, unspecified: Secondary | ICD-10-CM | POA: Diagnosis not present

## 2020-07-30 LAB — COMPREHENSIVE METABOLIC PANEL
ALT: 25 U/L (ref 0–53)
AST: 21 U/L (ref 0–37)
Albumin: 4.5 g/dL (ref 3.5–5.2)
Alkaline Phosphatase: 75 U/L (ref 39–117)
BUN: 21 mg/dL (ref 6–23)
CO2: 30 mEq/L (ref 19–32)
Calcium: 9.8 mg/dL (ref 8.4–10.5)
Chloride: 103 mEq/L (ref 96–112)
Creatinine, Ser: 1.05 mg/dL (ref 0.40–1.50)
GFR: 70.14 mL/min (ref >60.00)
Glucose, Bld: 100 mg/dL — ABNORMAL HIGH (ref 70–99)
Potassium: 4.4 mEq/L (ref 3.5–5.1)
Sodium: 141 mEq/L (ref 135–145)
Total Bilirubin: 0.5 mg/dL (ref 0.2–1.2)
Total Protein: 7.2 g/dL (ref 6.0–8.3)

## 2020-07-30 LAB — CBC WITH DIFFERENTIAL/PLATELET
Basophils Absolute: 0.1 10*3/uL (ref 0.0–0.1)
Basophils Relative: 0.7 % (ref 0.0–3.0)
Eosinophils Absolute: 0.3 10*3/uL (ref 0.0–0.7)
Eosinophils Relative: 3.9 % (ref 0.0–5.0)
HCT: 44.6 % (ref 39.0–52.0)
Hemoglobin: 15.4 g/dL (ref 13.0–17.0)
Lymphocytes Relative: 20.6 % (ref 12.0–46.0)
Lymphs Abs: 1.8 10*3/uL (ref 0.7–4.0)
MCHC: 34.6 g/dL (ref 30.0–36.0)
MCV: 89 fl (ref 78.0–100.0)
Monocytes Absolute: 0.7 10*3/uL (ref 0.1–1.0)
Monocytes Relative: 7.7 % (ref 3.0–12.0)
Neutro Abs: 5.9 10*3/uL (ref 1.4–7.7)
Neutrophils Relative %: 67.1 % (ref 43.0–77.0)
Platelets: 225 10*3/uL (ref 150.0–400.0)
RBC: 5.01 Mil/uL (ref 4.22–5.81)
RDW: 13.8 % (ref 11.5–15.5)
WBC: 8.9 10*3/uL (ref 4.0–10.5)

## 2020-07-30 LAB — LIPID PANEL
Cholesterol: 206 mg/dL — ABNORMAL HIGH (ref 0–200)
HDL: 77.8 mg/dL (ref >39.00)
LDL Cholesterol: 116 mg/dL — ABNORMAL HIGH (ref 0–99)
NonHDL: 128.51
Total CHOL/HDL Ratio: 3
Triglycerides: 65 mg/dL (ref 0.0–149.0)
VLDL: 13 mg/dL (ref 0.0–40.0)

## 2020-07-30 MED ORDER — LISINOPRIL 10 MG PO TABS: 10.0000 mg | ORAL_TABLET | Freq: Every day | ORAL | 1 refills | Status: DC

## 2020-07-30 NOTE — Progress Notes (Signed)
Joel [name] DOB: 1945/10/15 Encounter date: 07/30/2020  This is a 75 y.o. male who presents for complete physical   History of present illness/Additional concerns: Just got back from trip to [place], [place], [place] for 2 week trip.   HTN: amlodipine 10mg . He states that always in 130-140 range and 29-93 diastolic. No low pressures. No issues with headaches.   Follows with urology for BPH/hx of elevated PSA.   Does go to gym 3 days/week for weights and walks, rowing. Does a lot of yard work and walking regularly at home.   Past Medical History:  Diagnosis Date  . BENIGN PROSTATIC HYPERTROPHY 05/07/2009  . COLONIC POLYPS, HX OF 05/07/2009  . HYPERLIPIDEMIA 05/07/2009  . HYPERTENSION 05/07/2009   Past Surgical History:  Procedure Laterality Date  . HAND TENDON SURGERY     Left thumb  . NASAL SINUS SURGERY    . NM RENAL LASIX ([facility] HX)     No Known Allergies Current Meds  Medication Sig  . amLODipine (NORVASC) 10 MG tablet TAKE 1 TABLET BY MOUTH EVERY DAY  . aspirin 81 MG tablet Take 81 mg by mouth daily.  Marland Kitchen CALCIUM PO Take by mouth.  . cholecalciferol (VITAMIN D3) 25 MCG (1000 UNIT) tablet Take 1,000 Units by mouth daily.  Marland Kitchen CINNAMON PO Take by mouth.  . co-enzyme Q-10 30 MG capsule Take 30 mg by mouth 3 (three) times daily.  . finasteride (PROSCAR) 5 MG tablet Take 5 mg by mouth daily.  Marland Kitchen lisinopril (ZESTRIL) 10 MG tablet Take 1 tablet (10 mg total) by mouth daily.  . Multiple Vitamin (MULTIVITAMIN) capsule Take 1 capsule by mouth daily.  . Omega 3-6-9 Fatty Acids (OMEGA-3-6-9 PO) Take by mouth.  . Red Yeast Rice Extract (RED YEAST RICE PO) Take by mouth.  . tamsulosin (FLOMAX) 0.4 MG CAPS capsule Take 1 capsule (0.4 mg total) by mouth daily. (Patient taking differently: Take 0.8 mg by mouth daily.)   Social History   Tobacco Use  . Smoking status: Never Smoker  . Smokeless tobacco: Never Used  Substance Use Topics  . Alcohol use: Yes   Family History   Problem Relation Age of Onset  . Hyperlipidemia Mother   . Hypertension Mother   . Stroke Mother   . Hyperlipidemia Father   . Hypertension Father   . Stroke Father      Review of Systems  Constitutional: Negative for activity change, appetite change, chills, fatigue, fever and unexpected weight change.  HENT: Negative for congestion, ear pain, hearing loss, sinus pressure, sinus pain, sore throat and trouble swallowing.   Eyes: Negative for pain and visual disturbance.  Respiratory: Negative for cough, chest tightness, shortness of breath and wheezing.   Cardiovascular: Positive for leg swelling (lower ankle/foot swelling just after recent trip, plane ride). Negative for chest pain and palpitations.  Gastrointestinal: Negative for abdominal distention, abdominal pain, blood in stool, constipation, diarrhea, nausea and vomiting.  Genitourinary: Negative for decreased urine volume, difficulty urinating, dysuria, penile pain and testicular pain.  Musculoskeletal: Negative for arthralgias, back pain and joint swelling.  Skin: Negative for rash.  Neurological: Negative for dizziness, weakness, numbness and headaches.  Hematological: Negative for adenopathy. Does not bruise/bleed easily.  Psychiatric/Behavioral: Negative for agitation, sleep disturbance and suicidal ideas. The patient is not nervous/anxious.     CBC:  Lab Results  Component Value Date   WBC 8.9 07/30/2020   HGB 15.4 07/30/2020   HCT 44.6 07/30/2020   MCH 30.4 09/05/2014  MCHC 34.6 07/30/2020   RDW 13.8 07/30/2020   PLT 225.0 07/30/2020   CMP: Lab Results  Component Value Date   NA 141 07/30/2020   K 4.4 07/30/2020   CL 103 07/30/2020   CO2 30 07/30/2020   ANIONGAP 7 09/05/2014   GLUCOSE 100 (H) 07/30/2020   BUN 21 07/30/2020   CREATININE 1.05 07/30/2020   GFRAA >60 09/05/2014   CALCIUM 9.8 07/30/2020   PROT 7.2 07/30/2020   BILITOT 0.5 07/30/2020   ALKPHOS 75 07/30/2020   ALT 25 07/30/2020   AST 21  07/30/2020   LIPID: Lab Results  Component Value Date   CHOL 206 (H) 07/30/2020   TRIG 65.0 07/30/2020   HDL 77.80 07/30/2020   LDLCALC 116 (H) 07/30/2020    Objective:  BP 140/78 (BP Location: Left Arm, Patient Position: Sitting, Cuff Size: Large)   Pulse 63   Temp 98.1 F (36.7 C) (Oral)   Ht 5\' 5"  (1.651 m)   Wt 192 lb 12.8 oz (87.5 kg)   SpO2 97%   BMI 32.08 kg/m   Weight: 192 lb 12.8 oz (87.5 kg)   BP Readings from Last 3 Encounters:  07/30/20 140/78  07/06/19 118/80  03/02/19 136/80   Wt Readings from Last 3 Encounters:  07/30/20 192 lb 12.8 oz (87.5 kg)  07/06/19 188 lb 1.6 oz (85.3 kg)  03/02/19 186 lb 1.6 oz (84.4 kg)    Physical Exam Constitutional:      General: He is not in acute distress.    Appearance: He is well-developed.  HENT:     Head: Normocephalic and atraumatic.     Right Ear: External ear normal.     Left Ear: External ear normal.     Nose: Nose normal.     Mouth/Throat:     Pharynx: No oropharyngeal exudate.  Eyes:     Conjunctiva/sclera: Conjunctivae normal.     Pupils: Pupils are equal, round, and reactive to light.  Neck:     Thyroid: No thyromegaly.  Cardiovascular:     Rate and Rhythm: Normal rate and regular rhythm.     Heart sounds: Normal heart sounds. No murmur heard. No friction rub. No gallop.   Pulmonary:     Effort: Pulmonary effort is normal. No respiratory distress.     Breath sounds: Normal breath sounds. No stridor. No wheezing or rales.  Abdominal:     General: Bowel sounds are normal.     Palpations: Abdomen is soft.  Musculoskeletal:        General: Normal range of motion.     Cervical back: Neck supple.     Right lower leg: 1+ Pitting Edema present.     Left lower leg: 1+ Pitting Edema present.  Skin:    General: Skin is warm and dry.  Neurological:     Mental Status: He is alert and oriented to person, place, and time.  Psychiatric:        Behavior: Behavior normal.        Thought Content: Thought  content normal.        Judgment: Judgment normal.     Assessment/Plan: Health Maintenance Due  Topic Date Due  . TETANUS/TDAP  01/09/2020   Health Maintenance reviewed.  1. Preventative health care Keep up with regular exercise and healthy eating. We discussed lower sodium diet as this will help with bp and LE edema.  2. Bilateral hearing loss, unspecified hearing loss type Noting increased hearing loss; willing to further evaluate.  -  Ambulatory referral to Audiology  3. Essential hypertension Elevated today; further elevated on recheck. We are going to add lisinopril to regimen discussed that amlodipine can also contribute to lower extremity swelling.  I suspect the lower extremity swelling is more related to long flight, increased salt intake, recent inactivity due to travel.  I have asked him to check his blood pressure at home and let me know if remains elevated. - lisinopril (ZESTRIL) 10 MG tablet; Take 1 tablet (10 mg total) by mouth daily.  Dispense: 90 tablet; Refill: 1  4. Benign prostatic hyperplasia with nocturia He follows regularly with urology.  Currently taking Flomax and Proscar.  Symptoms are well managed.  5. Dyslipidemia He is taking red yeast rice and omega-3 fatty acids.  Return in about 3 months (around 10/30/2020) for Chronic condition visit.  Micheline Rough, MD

## 2020-07-30 NOTE — Patient Instructions (Addendum)
Ask at pharmacy about tetanus shot: Tdap  Update me on blood pressures in 2 weeks time through mychart. Continue with the amlodipine. Let me know before then if swelling is not improved.  Edema  Edema is an abnormal buildup of fluids in the body tissues and under the skin. Swelling of the legs, feet, and ankles is a common symptom that becomes more likely as you get older. Swelling is also common in looser tissues, like around the eyes. When the affected area is squeezed, the fluid may move out of that spot and leave a dent for a few moments. This dent is called pitting edema. There are many possible causes of edema. Eating too much salt (sodium) and being on your feet or sitting for a long time can cause edema in your legs, feet, and ankles. Hot weather may make edema worse. Common causes of edema include:  Heart failure.  Liver or kidney disease.  Weak leg blood vessels.  Cancer.  An injury.  Pregnancy.  Medicines.  Being obese.  Low protein levels in the blood. Edema is usually painless. Your skin may look swollen or shiny. Follow these instructions at home:  Keep the affected body part raised (elevated) above the level of your heart when you are sitting or lying down.  Do not sit still or stand for long periods of time.  Do not wear tight clothing. Do not wear garters on your upper legs.  Exercise your legs to get your circulation going. This helps to move the fluid back into your blood vessels, and it may help the swelling go down.  Wear elastic bandages or support stockings to reduce swelling as told by your health care provider.  Eat a low-salt (low-sodium) diet to reduce fluid as told by your health care provider.  Depending on the cause of your swelling, you may need to limit how much fluid you drink (fluid restriction).  Take over-the-counter and prescription medicines only as told by your health care provider. Contact a health care provider if:  Your edema does  not get better with treatment.  You have heart, liver, or kidney disease and have symptoms of edema.  You have sudden and unexplained weight gain. Get help right away if:  You develop shortness of breath or chest pain.  You cannot breathe when you lie down.  You develop pain, redness, or warmth in the swollen areas.  You have heart, liver, or kidney disease and suddenly get edema.  You have a fever and your symptoms suddenly get worse. Summary  Edema is an abnormal buildup of fluids in the body tissues and under the skin.  Eating too much salt (sodium) and being on your feet or sitting for a long time can cause edema in your legs, feet, and ankles.  Keep the affected body part raised (elevated) above the level of your heart when you are sitting or lying down. This information is not intended to replace advice given to you by your health care provider. Make sure you discuss any questions you have with your health care provider. Document Revised: 07/14/2018 Document Reviewed: 03/29/2016 Elsevier Patient Education  2021 [employer].

## 2020-08-16 ENCOUNTER — Encounter: Payer: Self-pay | Admitting: Family Medicine

## 2020-08-22 ENCOUNTER — Encounter: Payer: Self-pay | Admitting: Family Medicine

## 2020-09-12 ENCOUNTER — Other Ambulatory Visit: Payer: Self-pay

## 2020-09-12 ENCOUNTER — Ambulatory Visit: Payer: Medicare HMO | Attending: Family Medicine | Admitting: Audiologist

## 2020-09-12 DIAGNOSIS — H903 Sensorineural hearing loss, bilateral: Secondary | ICD-10-CM | POA: Insufficient documentation

## 2020-09-12 NOTE — Procedures (Signed)
  Outpatient Audiology and [facility], [place]  [id number] [phone]  AUDIOLOGICAL  EVALUATION  NAME: Joel [name]     DOB:   15-Dec-1945      MRN: [id number]                                                                                     DATE: 09/12/2020     REFERENT: Caren Macadam, MD STATUS: Outpatient DIAGNOSIS: Sensorineural Bilateral Hearing Loss   History: Trevonn was seen for an audiological evaluation. Jaxyn is receiving a hearing evaluation due to concerns for difficulty hearing in noise. Haley has difficulty hearing in the TV, crowds, and when people are at a distance. This difficulty began gradually. No pain or pressure reported in either ear. No tinnitus present in either ear. 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 He perforated his eardrum when he was [age].  Medical history negative for any medical risk factor for hearing loss. No other relevant case history reported.   Evaluation:  Otoscopy showed a clear view of the tympanic membranes, bilaterally Tympanometry results were consistent with normal middle ear function, bilaterally   Audiometric testing was completed using conventional audiometry with insert transducer. Speech Recognition Thresholds were consistent with pure tone averages. Word Recognition was excellent at an elevated level. Pure tone thresholds show mild sloping to moderately severe sensorineural hearing loss in both ears. Test results are consistent with presbycusis.  Results:  The test results were reviewed with Herbie [place]. He was counseled on the nature and degree of his hearing loss. He was provided with several copies of his audiogram that illustrate his degree of hearing loss in both ears. His hearing loss is in the high frequencies preventing Brynden from hearing high frequency consonants such as /s/ /sh/ /f/ /t/ and /th/. These sounds help  differentiate the words he  hears. Without these sounds, speech is muffled and unclear unless someone is face to face within 5 feet without a mask. Sherley is an excellent hearing aid candidate. With hearing aids the clarity of speech will improve and Chinmay will not have to work so hard to hear. Brysten was provided with a list of audiologists that dispense hearing aids and good communication strategies.    Recommendations: Amplification is necessary for both ears. Hearing aids can be purchased from a variety of locations. See provided list for locations in the [place] area.  If Jaylee is not ready for consistent use of amplification then recommend repeat audiologic evaluation after physical next year to monitor hearing loss progression and discuss amplification.    Alfonse Alpers  Audiologist, Au.D., CCC-A 09/12/2020  4:51 PM  Cc: Caren Macadam, MD

## 2020-09-20 DIAGNOSIS — Z7982 Long term (current) use of aspirin: Secondary | ICD-10-CM | POA: Diagnosis not present

## 2020-09-20 DIAGNOSIS — I10 Essential (primary) hypertension: Secondary | ICD-10-CM | POA: Diagnosis not present

## 2020-09-20 DIAGNOSIS — Z8249 Family history of ischemic heart disease and other diseases of the circulatory system: Secondary | ICD-10-CM | POA: Diagnosis not present

## 2020-09-20 DIAGNOSIS — E669 Obesity, unspecified: Secondary | ICD-10-CM | POA: Diagnosis not present

## 2020-09-20 DIAGNOSIS — Z823 Family history of stroke: Secondary | ICD-10-CM | POA: Diagnosis not present

## 2020-09-20 DIAGNOSIS — N400 Enlarged prostate without lower urinary tract symptoms: Secondary | ICD-10-CM | POA: Diagnosis not present

## 2020-09-20 DIAGNOSIS — Z6832 Body mass index (BMI) 32.0-32.9, adult: Secondary | ICD-10-CM | POA: Diagnosis not present

## 2020-10-02 ENCOUNTER — Other Ambulatory Visit: Payer: Self-pay | Admitting: Family Medicine

## 2020-10-16 DIAGNOSIS — H2513 Age-related nuclear cataract, bilateral: Secondary | ICD-10-CM | POA: Diagnosis not present

## 2020-10-16 DIAGNOSIS — H524 Presbyopia: Secondary | ICD-10-CM | POA: Diagnosis not present

## 2020-10-16 DIAGNOSIS — H43813 Vitreous degeneration, bilateral: Secondary | ICD-10-CM | POA: Diagnosis not present

## 2020-10-16 DIAGNOSIS — H5203 Hypermetropia, bilateral: Secondary | ICD-10-CM | POA: Diagnosis not present

## 2020-10-30 ENCOUNTER — Ambulatory Visit: Payer: Medicare HMO

## 2020-10-30 ENCOUNTER — Other Ambulatory Visit: Payer: Self-pay

## 2020-11-01 ENCOUNTER — Telehealth: Payer: Self-pay | Admitting: Family Medicine

## 2020-11-01 NOTE — Telephone Encounter (Signed)
Patient is returning a phone call back to [place].  Patient can be contacted at [phone].  Please advise.

## 2020-11-06 ENCOUNTER — Encounter: Payer: Self-pay | Admitting: Family Medicine

## 2020-11-14 ENCOUNTER — Ambulatory Visit (INDEPENDENT_AMBULATORY_CARE_PROVIDER_SITE_OTHER): Payer: Medicare HMO | Admitting: Family Medicine

## 2020-11-14 ENCOUNTER — Other Ambulatory Visit: Payer: Self-pay

## 2020-11-14 ENCOUNTER — Encounter: Payer: Self-pay | Admitting: Family Medicine

## 2020-11-14 VITALS — BP 130/70 | HR 81 | Temp 97.8°F | Ht 65.0 in | Wt 189.3 lb

## 2020-11-14 DIAGNOSIS — I10 Essential (primary) hypertension: Secondary | ICD-10-CM | POA: Diagnosis not present

## 2020-11-14 NOTE — Progress Notes (Signed)
Joel [name] DOB: 09/28/1945 Encounter date: 11/14/2020  This is a 75 y.o. male who presents with Chief Complaint  Patient presents with   Follow-up    History of present illness:  Went to [place] on [place] cruise in last month. Great trip.   Tested positive for COVID on 8/25 - had some congestion/cough. Test was negative on 8/30.   HTN: lisinopril was started and amlodipine continued at last visit. Has been checking at home regularly - average 130/mid 70's. Some days in 120's.   Follows with urology for BPH/hx of elevated PSA At last visit was having increased swelling in the legs; bp was also elevated. Swelling after recent flight, but resolved now.    No Known Allergies Current Meds  Medication Sig   amLODipine (NORVASC) 10 MG tablet TAKE 1 TABLET BY MOUTH EVERY DAY   aspirin 81 MG tablet Take 81 mg by mouth daily.   CALCIUM PO Take by mouth.   cholecalciferol (VITAMIN D3) 25 MCG (1000 UNIT) tablet Take 1,000 Units by mouth daily.   CINNAMON PO Take by mouth.   co-enzyme Q-10 30 MG capsule Take 30 mg by mouth 3 (three) times daily.   finasteride (PROSCAR) 5 MG tablet Take 5 mg by mouth daily.   lisinopril (ZESTRIL) 10 MG tablet Take 1 tablet (10 mg total) by mouth daily.   Multiple Vitamin (MULTIVITAMIN) capsule Take 1 capsule by mouth daily.   Omega 3-6-9 Fatty Acids (OMEGA-3-6-9 PO) Take by mouth.   Red Yeast Rice Extract (RED YEAST RICE PO) Take by mouth.   tamsulosin (FLOMAX) 0.4 MG CAPS capsule Take 1 capsule (0.4 mg total) by mouth daily. (Patient taking differently: Take 0.8 mg by mouth daily.)    Review of Systems  Constitutional:  Negative for chills, fatigue and fever.  Respiratory:  Negative for cough, chest tightness, shortness of breath and wheezing.   Cardiovascular:  Negative for chest pain, palpitations and leg swelling.   Objective:  BP 130/70 (BP Location: Left Arm, Patient Position: Sitting, Cuff Size: Large)   Pulse 81   Temp 97.8 F  (36.6 C) (Oral)   Ht '5\' 5"'$  (1.651 m)   Wt 189 lb 4.8 oz (85.9 kg)   BMI 31.50 kg/m   Weight: 189 lb 4.8 oz (85.9 kg)   BP Readings from Last 3 Encounters:  11/14/20 130/70  07/30/20 140/78  07/06/19 118/80   Wt Readings from Last 3 Encounters:  11/14/20 189 lb 4.8 oz (85.9 kg)  07/30/20 192 lb 12.8 oz (87.5 kg)  07/06/19 188 lb 1.6 oz (85.3 kg)    Physical Exam Constitutional:      General: He is not in acute distress.    Appearance: He is well-developed.  Cardiovascular:     Rate and Rhythm: Normal rate and regular rhythm.     Heart sounds: Normal heart sounds. No murmur heard.   No friction rub.  Pulmonary:     Effort: Pulmonary effort is normal. No respiratory distress.     Breath sounds: Normal breath sounds. No wheezing or rales.  Musculoskeletal:     Right lower leg: No edema.     Left lower leg: No edema.  Neurological:     Mental Status: He is alert and oriented to person, place, and time.  Psychiatric:        Behavior: Behavior normal.    Assessment/Plan  1. Essential hypertension Improved control. Continue with lisinopril '10mg'$  daily and amlodipine '10mg'$ .    Return in about 6  months (around 05/14/2021) for Chronic condition visit.     Micheline Rough, MD

## 2020-11-27 ENCOUNTER — Telehealth: Payer: Self-pay | Admitting: Family Medicine

## 2020-11-27 NOTE — Telephone Encounter (Signed)
Left message for patient to call back and schedule Medicare Annual Wellness Visit (AWV) either virtually or in office. Left  my jabber number [phone]   Last AWV 10/26/19  please schedule at anytime with [facility] Nurse Health Advisor 1 or 2   This should be a 45 minute visit.  

## 2020-12-18 DIAGNOSIS — L309 Dermatitis, unspecified: Secondary | ICD-10-CM | POA: Diagnosis not present

## 2020-12-18 DIAGNOSIS — Z85828 Personal history of other malignant neoplasm of skin: Secondary | ICD-10-CM | POA: Diagnosis not present

## 2021-01-04 ENCOUNTER — Other Ambulatory Visit: Payer: Self-pay | Admitting: Family Medicine

## 2021-01-04 DIAGNOSIS — I10 Essential (primary) hypertension: Secondary | ICD-10-CM

## 2021-03-13 DIAGNOSIS — Z6830 Body mass index (BMI) 30.0-30.9, adult: Secondary | ICD-10-CM | POA: Diagnosis not present

## 2021-03-13 DIAGNOSIS — E669 Obesity, unspecified: Secondary | ICD-10-CM | POA: Diagnosis not present

## 2021-03-13 DIAGNOSIS — Z7982 Long term (current) use of aspirin: Secondary | ICD-10-CM | POA: Diagnosis not present

## 2021-03-13 DIAGNOSIS — I10 Essential (primary) hypertension: Secondary | ICD-10-CM | POA: Diagnosis not present

## 2021-03-13 DIAGNOSIS — Z008 Encounter for other general examination: Secondary | ICD-10-CM | POA: Diagnosis not present

## 2021-03-13 DIAGNOSIS — N400 Enlarged prostate without lower urinary tract symptoms: Secondary | ICD-10-CM | POA: Diagnosis not present

## 2021-03-26 ENCOUNTER — Encounter: Payer: Self-pay | Admitting: Family Medicine

## 2021-04-03 ENCOUNTER — Other Ambulatory Visit: Payer: Self-pay | Admitting: Family Medicine

## 2021-04-10 DIAGNOSIS — I788 Other diseases of capillaries: Secondary | ICD-10-CM | POA: Diagnosis not present

## 2021-04-10 DIAGNOSIS — L570 Actinic keratosis: Secondary | ICD-10-CM | POA: Diagnosis not present

## 2021-04-10 DIAGNOSIS — L309 Dermatitis, unspecified: Secondary | ICD-10-CM | POA: Diagnosis not present

## 2021-04-10 DIAGNOSIS — L821 Other seborrheic keratosis: Secondary | ICD-10-CM | POA: Diagnosis not present

## 2021-04-10 DIAGNOSIS — Z85828 Personal history of other malignant neoplasm of skin: Secondary | ICD-10-CM | POA: Diagnosis not present

## 2021-04-10 DIAGNOSIS — L820 Inflamed seborrheic keratosis: Secondary | ICD-10-CM | POA: Diagnosis not present

## 2021-06-11 DIAGNOSIS — D692 Other nonthrombocytopenic purpura: Secondary | ICD-10-CM | POA: Diagnosis not present

## 2021-06-11 DIAGNOSIS — Z85828 Personal history of other malignant neoplasm of skin: Secondary | ICD-10-CM | POA: Diagnosis not present

## 2021-06-11 DIAGNOSIS — D485 Neoplasm of uncertain behavior of skin: Secondary | ICD-10-CM | POA: Diagnosis not present

## 2021-06-11 DIAGNOSIS — L249 Irritant contact dermatitis, unspecified cause: Secondary | ICD-10-CM | POA: Diagnosis not present

## 2021-06-11 DIAGNOSIS — L988 Other specified disorders of the skin and subcutaneous tissue: Secondary | ICD-10-CM | POA: Diagnosis not present

## 2021-06-29 ENCOUNTER — Other Ambulatory Visit: Payer: Self-pay | Admitting: Family Medicine

## 2021-07-02 DIAGNOSIS — R972 Elevated prostate specific antigen [PSA]: Secondary | ICD-10-CM | POA: Diagnosis not present

## 2021-07-04 ENCOUNTER — Other Ambulatory Visit: Payer: Self-pay | Admitting: Family Medicine

## 2021-07-04 DIAGNOSIS — I10 Essential (primary) hypertension: Secondary | ICD-10-CM

## 2021-07-09 DIAGNOSIS — R3915 Urgency of urination: Secondary | ICD-10-CM | POA: Diagnosis not present

## 2021-07-09 DIAGNOSIS — R972 Elevated prostate specific antigen [PSA]: Secondary | ICD-10-CM | POA: Diagnosis not present

## 2021-07-31 ENCOUNTER — Other Ambulatory Visit: Payer: Self-pay | Admitting: Family Medicine

## 2021-07-31 ENCOUNTER — Ambulatory Visit (INDEPENDENT_AMBULATORY_CARE_PROVIDER_SITE_OTHER): Payer: Medicare HMO | Admitting: Family Medicine

## 2021-07-31 ENCOUNTER — Encounter: Payer: Self-pay | Admitting: Family Medicine

## 2021-07-31 VITALS — BP 130/80 | HR 65 | Temp 97.6°F | Resp 99 | Ht 65.5 in | Wt 186.5 lb

## 2021-07-31 DIAGNOSIS — E785 Hyperlipidemia, unspecified: Secondary | ICD-10-CM

## 2021-07-31 DIAGNOSIS — R739 Hyperglycemia, unspecified: Secondary | ICD-10-CM

## 2021-07-31 DIAGNOSIS — Z0000 Encounter for general adult medical examination without abnormal findings: Secondary | ICD-10-CM | POA: Diagnosis not present

## 2021-07-31 DIAGNOSIS — R351 Nocturia: Secondary | ICD-10-CM | POA: Diagnosis not present

## 2021-07-31 DIAGNOSIS — N401 Enlarged prostate with lower urinary tract symptoms: Secondary | ICD-10-CM

## 2021-07-31 DIAGNOSIS — I10 Essential (primary) hypertension: Secondary | ICD-10-CM

## 2021-07-31 LAB — LIPID PANEL
Cholesterol: 230 mg/dL — ABNORMAL HIGH (ref 0–200)
HDL: 76.3 mg/dL (ref >39.00)
LDL Cholesterol: 134 mg/dL — ABNORMAL HIGH (ref 0–99)
NonHDL: 153.43
Total CHOL/HDL Ratio: 3
Triglycerides: 98 mg/dL (ref 0.0–149.0)
VLDL: 19.6 mg/dL (ref 0.0–40.0)

## 2021-07-31 LAB — COMPREHENSIVE METABOLIC PANEL
ALT: 27 U/L (ref 0–53)
AST: 29 U/L (ref 0–37)
Albumin: 4.6 g/dL (ref 3.5–5.2)
Alkaline Phosphatase: 73 U/L (ref 39–117)
BUN: 17 mg/dL (ref 6–23)
CO2: 29 mEq/L (ref 19–32)
Calcium: 10 mg/dL (ref 8.4–10.5)
Chloride: 100 mEq/L (ref 96–112)
Creatinine, Ser: 1.02 mg/dL (ref 0.40–1.50)
GFR: 72.11 mL/min (ref >60.00)
Glucose, Bld: 96 mg/dL (ref 70–99)
Potassium: 4.2 mEq/L (ref 3.5–5.1)
Sodium: 137 mEq/L (ref 135–145)
Total Bilirubin: 0.7 mg/dL (ref 0.2–1.2)
Total Protein: 7.7 g/dL (ref 6.0–8.3)

## 2021-07-31 LAB — CBC WITH DIFFERENTIAL/PLATELET
Basophils Absolute: 0.1 10*3/uL (ref 0.0–0.1)
Basophils Relative: 1 % (ref 0.0–3.0)
Eosinophils Absolute: 0.2 10*3/uL (ref 0.0–0.7)
Eosinophils Relative: 3 % (ref 0.0–5.0)
HCT: 44 % (ref 39.0–52.0)
Hemoglobin: 15 g/dL (ref 13.0–17.0)
Lymphocytes Relative: 26.2 % (ref 12.0–46.0)
Lymphs Abs: 1.7 10*3/uL (ref 0.7–4.0)
MCHC: 34.1 g/dL (ref 30.0–36.0)
MCV: 89.7 fl (ref 78.0–100.0)
Monocytes Absolute: 0.5 10*3/uL (ref 0.1–1.0)
Monocytes Relative: 7.8 % (ref 3.0–12.0)
Neutro Abs: 4 10*3/uL (ref 1.4–7.7)
Neutrophils Relative %: 62 % (ref 43.0–77.0)
Platelets: 232 10*3/uL (ref 150.0–400.0)
RBC: 4.91 Mil/uL (ref 4.22–5.81)
RDW: 13.4 % (ref 11.5–15.5)
WBC: 6.5 10*3/uL (ref 4.0–10.5)

## 2021-07-31 LAB — HEMOGLOBIN A1C: Hgb A1c MFr Bld: 5.6 % (ref 4.6–6.5)

## 2021-07-31 MED ORDER — LISINOPRIL 10 MG PO TABS: 10.0000 mg | ORAL_TABLET | Freq: Every day | ORAL | 1 refills | Status: DC

## 2021-07-31 NOTE — Progress Notes (Signed)
Joel [name] DOB: [birth year] Encounter date: 07/31/2021  This is a 76 y.o. male who presents for complete physical   History of present illness/Additional concerns: Last visit with me was 11/14/2020.  Watching what he eats a little. Walking, biking and gym 3 times/week. Drinks smoothie at home about 5 times/week.   Hypertension: Amlodipine 10 mg daily, lisinopril 10 mg daily. Staying well at home. Similar to today's readings. Upper 120's to mid 130's. Diastolic does stay in the 70's. 673 systolic is lowest reading he has seen at home.   Follows with urology for history of BPH and elevated PSA. Had labs last week and had improvement in PSA. They are stopping routine blood tests on him.   Last colonoscopy was 05/2017.  He is due for repeat 05/2022. Regular eye check, dentist, dermatology.   Past Medical History:  Diagnosis Date   BENIGN PROSTATIC HYPERTROPHY 05/07/2009   COLONIC POLYPS, HX OF 05/07/2009   HYPERLIPIDEMIA 05/07/2009   HYPERTENSION 05/07/2009   Past Surgical History:  Procedure Laterality Date   HAND TENDON SURGERY     Left thumb   NASAL SINUS SURGERY     NM RENAL LASIX ([facility] HX)     No Known Allergies Current Meds  Medication Sig   amLODipine (NORVASC) 10 MG tablet TAKE 1 TABLET BY MOUTH EVERY DAY   aspirin 81 MG tablet Take 81 mg by mouth daily.   CALCIUM PO Take by mouth.   cholecalciferol (VITAMIN D3) 25 MCG (1000 UNIT) tablet Take 1,000 Units by mouth daily.   CINNAMON PO Take by mouth.   co-enzyme Q-10 30 MG capsule Take 30 mg by mouth 3 (three) times daily.   finasteride (PROSCAR) 5 MG tablet Take 5 mg by mouth daily.   lisinopril (ZESTRIL) 10 MG tablet TAKE 1 TABLET BY MOUTH EVERY DAY   Multiple Vitamin (MULTIVITAMIN) capsule Take 1 capsule by mouth daily.   Omega 3-6-9 Fatty Acids (OMEGA-3-6-9 PO) Take by mouth.   Red Yeast Rice Extract (RED YEAST RICE PO) Take by mouth.   tamsulosin (FLOMAX) 0.4 MG CAPS capsule Take 2 capsules by mouth daily.   Social  History   Tobacco Use   Smoking status: Never   Smokeless tobacco: Never  Substance Use Topics   Alcohol use: Yes   Family History  Problem Relation Age of Onset   Hyperlipidemia Mother    Hypertension Mother    Stroke Mother    Aortic dissection Mother 57   Hyperlipidemia Father    Hypertension Father    Stroke Father 42       cerebellar herniation/hemorrhage     Review of Systems  Constitutional:  Negative for activity change, appetite change, chills, fatigue, fever and unexpected weight change.  HENT:  Negative for congestion, ear pain, hearing loss, sinus pressure, sinus pain, sore throat and trouble swallowing.   Eyes:  Negative for pain and visual disturbance.  Respiratory:  Negative for cough, chest tightness, shortness of breath and wheezing.   Cardiovascular:  Negative for chest pain, palpitations and leg swelling.  Gastrointestinal:  Negative for abdominal distention, abdominal pain, blood in stool, constipation, diarrhea, nausea and vomiting.  Genitourinary:  Negative for decreased urine volume, difficulty urinating, dysuria, penile pain and testicular pain.  Musculoskeletal:  Negative for arthralgias, back pain and joint swelling.  Skin:  Negative for rash.  Neurological:  Negative for dizziness, weakness, numbness and headaches.  Hematological:  Negative for adenopathy. Does not bruise/bleed easily.  Psychiatric/Behavioral:  Negative for agitation, sleep  disturbance and suicidal ideas. The patient is not nervous/anxious.    CBC:  Lab Results  Component Value Date   WBC 8.9 07/30/2020   HGB 15.4 07/30/2020   HCT 44.6 07/30/2020   MCH 30.4 09/05/2014   MCHC 34.6 07/30/2020   RDW 13.8 07/30/2020   PLT 225.0 07/30/2020   CMP: Lab Results  Component Value Date   NA 141 07/30/2020   K 4.4 07/30/2020   CL 103 07/30/2020   CO2 30 07/30/2020   ANIONGAP 7 09/05/2014   GLUCOSE 100 (H) 07/30/2020   BUN 21 07/30/2020   CREATININE 1.05 07/30/2020   GFRAA >60  09/05/2014   CALCIUM 9.8 07/30/2020   PROT 7.2 07/30/2020   BILITOT 0.5 07/30/2020   ALKPHOS 75 07/30/2020   ALT 25 07/30/2020   AST 21 07/30/2020   LIPID: Lab Results  Component Value Date   CHOL 206 (H) 07/30/2020   TRIG 65.0 07/30/2020   HDL 77.80 07/30/2020   LDLCALC 116 (H) 07/30/2020    Objective:  BP 130/80 (BP Location: Left Arm, Patient Position: Sitting, Cuff Size: Normal)   Pulse 65   Temp 97.6 F (36.4 C) (Oral)   Resp (!) 99   Ht 5' 5.5" (1.664 m)   Wt 186 lb 8 oz (84.6 kg)   BMI 30.56 kg/m   Weight: 186 lb 8 oz (84.6 kg)   BP Readings from Last 3 Encounters:  07/31/21 130/80  11/14/20 130/70  07/30/20 140/78   Wt Readings from Last 3 Encounters:  07/31/21 186 lb 8 oz (84.6 kg)  11/14/20 189 lb 4.8 oz (85.9 kg)  07/30/20 192 lb 12.8 oz (87.5 kg)    Physical Exam Constitutional:      General: He is not in acute distress.    Appearance: He is well-developed.  HENT:     Head: Normocephalic and atraumatic.     Right Ear: External ear normal.     Left Ear: External ear normal.     Nose: Nose normal.     Mouth/Throat:     Pharynx: No oropharyngeal exudate.  Eyes:     Conjunctiva/sclera: Conjunctivae normal.     Pupils: Pupils are equal, round, and reactive to light.  Neck:     Thyroid: No thyromegaly.  Cardiovascular:     Rate and Rhythm: Normal rate and regular rhythm.     Heart sounds: Normal heart sounds. No murmur heard.   No friction rub. No gallop.  Pulmonary:     Effort: Pulmonary effort is normal. No respiratory distress.     Breath sounds: Normal breath sounds. No stridor. No wheezing or rales.  Abdominal:     General: Bowel sounds are normal.     Palpations: Abdomen is soft.  Musculoskeletal:        General: Normal range of motion.     Cervical back: Neck supple.     Right lower leg: Right lower leg edema: trace.     Left lower leg: Left lower leg edema: trace.  Skin:    General: Skin is warm and dry.  Neurological:     Mental  Status: He is alert and oriented to person, place, and time.  Psychiatric:        Behavior: Behavior normal.        Thought Content: Thought content normal.        Judgment: Judgment normal.    Assessment/Plan: There are no preventive care reminders to display for this patient. Health Maintenance reviewed.  1.  Preventative health care Keep working on healthy eating, regular exercise.  He is interested in continuing to lose some weight.  We will check blood work today and make sure blood sugars are also well controlled since slightly elevated on last labs.  May need to cut back on some of his carbohydrate intake if sugars elevated, which we discussed today.  2. Essential hypertension Blood pressure has been stable and better controlled on the lisinopril.  We did discuss increasing to 20 mg if he is seeing blood pressures 130+ on a regular basis.  Since he is still actively working on losing weight I think it is reasonable to monitor for another 6 months and adjust dosing only if needed at that time. - CBC with Differential/Platelet; Future - Comprehensive metabolic panel; Future - Comprehensive metabolic panel - CBC with Differential/Platelet  3. Benign prostatic hyperplasia with nocturia Following with urology.  No further PSA per urology.  4. Dyslipidemia Has been diet controlled. - Lipid panel; Future - Lipid panel  5. Hyperglycemia - Hemoglobin A1c; Future - Hemoglobin A1c    Return in about 6 months (around 01/31/2022) for establish care, chronic condition.  Micheline Rough, MD

## 2021-08-08 ENCOUNTER — Telehealth: Payer: Self-pay

## 2021-08-08 NOTE — Telephone Encounter (Signed)
During result note call, pt requested to do a bp check to check his home bp cuff against ours. Instructed will need to get a doctor's order & he will be notified. Pt verb understanding.

## 2021-08-12 NOTE — Telephone Encounter (Signed)
Pt notified & appt scheduled 08/15/21 at [facility].

## 2021-08-15 ENCOUNTER — Ambulatory Visit (INDEPENDENT_AMBULATORY_CARE_PROVIDER_SITE_OTHER): Payer: Medicare HMO

## 2021-08-15 VITALS — BP 138/66

## 2021-08-15 DIAGNOSIS — I10 Essential (primary) hypertension: Secondary | ICD-10-CM

## 2021-08-15 NOTE — Progress Notes (Signed)
Joel [name] is a 76 y.o. male presents to the office today for Blood pressure recheck secondary to pt wanting to check his home cuff against BP measurement. .  Blood pressure medication: Amolodipine '10mg'$  daily (med, dose, frequency) Blood pressure was taken in the right arm after patient rested for 5 minutes.  BP 138/66 (BP Location: Right Arm, Patient Position: Sitting, Cuff Size: Normal)  Pt home cuff was 142/69 Dr Sarajane Jews aware, no new orders received.  [name]

## 2021-08-22 ENCOUNTER — Ambulatory Visit (INDEPENDENT_AMBULATORY_CARE_PROVIDER_SITE_OTHER): Payer: Medicare HMO

## 2021-08-22 VITALS — Ht 65.5 in | Wt 182.0 lb

## 2021-08-22 DIAGNOSIS — Z0000 Encounter for general adult medical examination without abnormal findings: Secondary | ICD-10-CM | POA: Diagnosis not present

## 2021-08-22 NOTE — Progress Notes (Signed)
Subjective:   Joel [name] is a 76 y.o. male who presents for Medicare Annual/Subsequent preventive examination.  Review of Systems    Virtual Visit via Telephone Note  I connected with  Joel [name] on 08/22/21 at  3:45 PM EDT by telephone and verified that I am speaking with the correct person using two identifiers.  Location: Patient: Home Provider: Office Persons participating in the virtual visit: patient/Nurse Health Advisor   I discussed the limitations, risks, security and privacy concerns of performing an evaluation and management service by telephone and the availability of in person appointments. The patient expressed understanding and agreed to proceed.  Interactive audio and video telecommunications were attempted between this nurse and patient, however failed, due to patient having technical difficulties OR patient did not have access to video capability.  We continued and completed visit with audio only.  Some vital signs may be absent or patient reported.   Criselda Peaches, LPN  Cardiac Risk Factors include: advanced age (>5mn, >>58women);hypertension;male gender     Objective:    Today's Vitals   08/22/21 1544  Weight: 182 lb (82.6 kg)  Height: 5' 5.5" (1.664 m)   Body mass index is 29.83 kg/m.     08/22/2021    3:52 PM 10/26/2019    3:17 PM 03/17/2019    3:35 PM 09/05/2014    6:02 PM  Advanced Directives  Does Patient Have a Medical Advance Directive? Yes Yes Yes No  Type of [place]of [place] will [place] will [place] will   Does patient want to make changes to medical advance directive? No - Patient declined  No - Patient declined   Copy of [place] Chart? No - copy requested No - copy requested No - copy requested   Would patient like information on creating a medical advance directive?    No - patient declined information     Current Medications (verified) Outpatient Encounter Medications as of 08/22/2021  Medication Sig   amLODipine (NORVASC) 10 MG tablet TAKE 1 TABLET BY MOUTH EVERY DAY   aspirin 81 MG tablet Take 81 mg by mouth daily.   CALCIUM PO Take by mouth.   cholecalciferol (VITAMIN D3) 25 MCG (1000 UNIT) tablet Take 1,000 Units by mouth daily.   CINNAMON PO Take by mouth.   co-enzyme Q-10 30 MG capsule Take 30 mg by mouth 3 (three) times daily.   finasteride (PROSCAR) 5 MG tablet Take 5 mg by mouth daily.   lisinopril (ZESTRIL) 10 MG tablet Take 1 tablet (10 mg total) by mouth daily.   Multiple Vitamin (MULTIVITAMIN) capsule Take 1 capsule by mouth daily.   Omega 3-6-9 Fatty Acids (OMEGA-3-6-9 PO) Take by mouth.   Red Yeast Rice Extract (RED YEAST RICE PO) Take by mouth.   tamsulosin (FLOMAX) 0.4 MG CAPS capsule Take 2 capsules by mouth daily.   No facility-administered encounter medications on file as of 08/22/2021.    Allergies (verified) Patient has no known allergies.   History: Past Medical History:  Diagnosis Date   BENIGN PROSTATIC HYPERTROPHY 05/07/2009   COLONIC POLYPS, HX OF 05/07/2009   HYPERLIPIDEMIA 05/07/2009   HYPERTENSION 05/07/2009   Past Surgical History:  Procedure Laterality Date   HAND TENDON SURGERY     Left thumb   NASAL SINUS SURGERY     NM RENAL LASIX ([facility] HX)     Family History  Problem Relation Age of  Onset   Hyperlipidemia Mother    Hypertension Mother    Stroke Mother    Aortic dissection Mother 23   Hyperlipidemia Father    Hypertension Father    Stroke Father 59       cerebellar herniation/hemorrhage   Social History   Socioeconomic History   Marital status: Married    Spouse name: Not on file   Number of children: Not on file   Years of education: Not on file   Highest education level: Not on file  Occupational History   Occupation: retired  Tobacco Use   Smoking status: Never   Smokeless tobacco: Never  Vaping Use   Vaping Use: Never  used  Substance and Sexual Activity   Alcohol use: Yes   Drug use: No   Sexual activity: Not on file  Other Topics Concern   Not on file  Social History Narrative   Not on file   Social Determinants of Health   Financial Resource Strain: Low Risk  (08/22/2021)   Overall Financial Resource Strain (CARDIA)    Difficulty of Paying Living Expenses: Not hard at all  Food Insecurity: No Food Insecurity (08/22/2021)   Hunger Vital Sign    Worried About Running Out of Food in the Last Year: Never true    [place] in the Last Year: Never true  Transportation Needs: No Transportation Needs (08/22/2021)   PRAPARE - Hydrologist (Medical): No    Lack of Transportation (Non-Medical): No  Physical Activity: Sufficiently Active (08/22/2021)   Exercise Vital Sign    Days of Exercise per Week: 3 days    Minutes of Exercise per Session: 120 min  Stress: No Stress Concern Present (08/22/2021)   [place]    Feeling of Stress : Not at all  Social Connections: Moderately Integrated (08/22/2021)   Social Connection and Isolation Panel [NHANES]    Frequency of Communication with Friends and Family: More than three times a week    Frequency of Social Gatherings with Friends and Family: More than three times a week    Attends Religious Services: Never    Marine scientist or Organizations: Yes    Attends Music therapist: More than 4 times per year    Marital Status: Married    Tobacco Counseling Counseling given: Not Answered   Clinical Intake: How often do you need to have someone help you when you read instructions, pamphlets, or other written materials from your doctor or pharmacy?: 1 - Never  Diabetic?  No  Interpreter Needed?: No Activities of Daily Living    08/22/2021    3:51 PM  In your present state of health, do you have any difficulty performing the following  activities:  Hearing? 0  Vision? 0  Difficulty concentrating or making decisions? 0  Walking or climbing stairs? 0  Dressing or bathing? 0  Doing errands, shopping? 0  Preparing Food and eating ? N  Using the Toilet? N  In the past six months, have you accidently leaked urine? N  Do you have problems with loss of bowel control? N  Managing your Medications? N  Managing your Finances? N  Housekeeping or managing your Housekeeping? N    Patient Care Team: Caren Macadam, MD (Inactive) as PCP - General (Family Medicine) Franchot Gallo, MD as Consulting Physician (Urology) Martinique, Amy, MD as Consulting Physician (Dermatology)  Indicate any recent Medical  Services you may have received from other than [facility] providers in the past year (date may be approximate).     Assessment:   This is a routine wellness examination for Trev.  Hearing/Vision screen Hearing Screening - Comments:: No hearing difficulty Vision Screening - Comments:: Wears reading glasses. Followed by Dr Delman Cheadle  Dietary issues and exercise activities discussed: Exercise limited by: None identified   Goals Addressed               This Visit's Progress     Patient Stated (pt-stated)        Lose a couple pounds.       Depression Screen    08/22/2021    3:49 PM 07/31/2021   11:34 AM 07/30/2020    8:51 AM 10/26/2019    3:15 PM 07/06/2019    7:57 AM 03/24/2016    9:40 AM 03/20/2015    2:58 PM  PHQ 2/9 Scores  PHQ - 2 Score 0 0 0 0 0 0 0  PHQ- 9 Score 0 0 0        Fall Risk    08/22/2021    3:51 PM 07/31/2021   11:35 AM 10/26/2019    3:19 PM 07/06/2019    7:56 AM 03/24/2016    9:40 AM  [place] in the past year? 0 0 0 0 No  Number falls in past yr: 0 0 0 0   Injury with Fall? 0 0 0 0   Risk for fall due to : No Fall Risks No Fall Risks     Follow up  Falls evaluation completed Falls prevention discussed      FALL RISK PREVENTION PERTAINING TO THE HOME:  Any stairs in or around the  home? Yes  If so, are there any without handrails? No  Home free of loose throw rugs in walkways, pet beds, electrical cords, etc? Yes  Adequate lighting in your home to reduce risk of falls? Yes   ASSISTIVE DEVICES UTILIZED TO PREVENT FALLS:  Life alert? No  Use of a cane, walker or w/c? No  Grab bars in the bathroom? Yes  Shower chair or bench in shower? No  Elevated toilet seat or a handicapped toilet? No   TIMED UP AND GO:  Was the test performed? No . Audio Visit  Cognitive Function:        08/22/2021    3:52 PM 10/26/2019    3:28 PM  6CIT Screen  What Year? 0 points 0 points  What month? 0 points 0 points  What time? 0 points   Count back from 20 0 points 0 points  Months in reverse 0 points 0 points  Repeat phrase 0 points 0 points  Total Score 0 points     Immunizations Immunization History  Administered Date(s) Administered   Fluad Quad(high Dose 65+) 12/14/2018   Influenza Split 01/01/2011   Influenza Whole 01/08/2010   Influenza, High Dose Seasonal PF 01/13/2014, 02/22/2016, 01/11/2018   Influenza,inj,Quad PF,6+ Mos 03/20/2015   Influenza-Unspecified 02/22/2016, 01/28/2017, 12/14/2018, 12/20/2020   PFIZER(Purple Top)SARS-COV-2 Vaccination 04/07/2019, 05/05/2019, 12/13/2019, 04/10/2020   Pfizer Covid-19 Vaccine Bivalent Booster 80yr & up 03/18/2021   Pneumococcal Conjugate-13 10/27/2013   Pneumococcal Polysaccharide-23 09/24/2012   Td 01/08/2010   Tdap 08/08/2020   Zoster Recombinat (Shingrix) 09/22/2018, 12/14/2018   Zoster, Live 01/08/2010    TDAP status: Up to date  Flu Vaccine status: Up to date  Pneumococcal vaccine status: Up to date  Covid-19 vaccine  status: Completed vaccines  Qualifies for Shingles Vaccine? Yes   Zostavax completed Yes   Shingrix Completed?: Yes  Screening Tests Health Maintenance  Topic Date Due   COVID-19 Vaccine (6 - Pfizer series) 09/07/2021 (Originally 07/16/2021)   INFLUENZA VACCINE  10/08/2021   COLONOSCOPY  (Pts 45-48yr Insurance coverage will need to be confirmed)  05/16/2027   TETANUS/TDAP  08/09/2030   Pneumonia Vaccine 75 Years old  Completed   Hepatitis C Screening  Completed   Zoster Vaccines- Shingrix  Completed   HPV VACCINES  Aged Out    Health Maintenance  There are no preventive care reminders to display for this patient.   Colorectal cancer screening: No longer required.   Lung Cancer Screening: (Low Dose CT Chest recommended if Age 75-80years, 30 pack-year currently smoking OR have quit w/in 15years.) does not qualify.     Additional Screening:  Hepatitis C Screening: does qualify; Completed 03/31/17  Vision Screening: Recommended annual ophthalmology exams for early detection of glaucoma and other disorders of the eye. Is the patient up to date with their annual eye exam?  Yes  Who is the provider or what is the name of the office in which the patient attends annual eye exams? Dr GDelman CheadleIf pt is not established with a provider, would they like to be referred to a provider to establish care? No .   Dental Screening: Recommended annual dental exams for proper oral hygiene  Community Resource Referral / Chronic Care Management:  CRR required this visit?  No   CCM required this visit?  No      Plan:     I have personally reviewed and noted the following in the patient's chart:   Medical and social history Use of alcohol, tobacco or illicit drugs  Current medications and supplements including opioid prescriptions. Patient is not currently taking opioid prescriptions. Functional ability and status Nutritional status Physical activity Advanced directives List of other physicians Hospitalizations, surgeries, and ER visits in previous 12 months Vitals Screenings to include cognitive, depression, and falls Referrals and appointments  In addition, I have reviewed and discussed with patient certain preventive protocols, quality metrics, and best practice  recommendations. A written personalized care plan for preventive services as well as general preventive health recommendations were provided to patient.     BCriselda Peaches LPN   [id number]  Nurse Notes: None

## 2021-08-22 NOTE — Patient Instructions (Signed)
[name] , Thank you for taking time to come for your Medicare Wellness Visit. I appreciate your ongoing commitment to your health goals. Please review the following plan we discussed and let me know if I can assist you in the future.   These are the goals we discussed:  Goals       Patient Stated (pt-stated)      Lose a couple pounds.        This is a list of the screening recommended for you and due dates:  Health Maintenance  Topic Date Due   COVID-19 Vaccine (6 - Pfizer series) 09/07/2021*   Flu Shot  10/08/2021   Colon Cancer Screening  05/16/2027   Tetanus Vaccine  08/09/2030   Pneumonia Vaccine  Completed   Hepatitis C Screening: USPSTF Recommendation to screen - Ages 18-79 yo.  Completed   Zoster (Shingles) Vaccine  Completed   HPV Vaccine  Aged Out  *Topic was postponed. The date shown is not the original due date.   Advanced directives: Yes  Conditions/risks identified: No  Next appointment: Follow up in one year for your annual wellness visit.   Preventive Care 75 Years and Older, Male Preventive care refers to lifestyle choices and visits with your health care provider that can promote health and wellness. What does preventive care include? A yearly physical exam. This is also called an annual well check. Dental exams once or twice a year. Routine eye exams. Ask your health care provider how often you should have your eyes checked. Personal lifestyle choices, including: Daily care of your teeth and gums. Regular physical activity. Eating a healthy diet. Avoiding tobacco and drug use. Limiting alcohol use. Practicing safe sex. Taking low doses of aspirin every day. Taking vitamin and mineral supplements as recommended by your health care provider. What happens during an annual well check? The services and screenings done by your health care provider during your annual well check will depend on your age, overall health, lifestyle risk factors, and family  history of disease. Counseling  Your health care provider may ask you questions about your: Alcohol use. Tobacco use. Drug use. Emotional well-being. Home and relationship well-being. Sexual activity. Eating habits. History of falls. Memory and ability to understand (cognition). Work and work Statistician. Screening  You may have the following tests or measurements: Height, weight, and BMI. Blood pressure. Lipid and cholesterol levels. These may be checked every 5 years, or more frequently if you are over 75 years old. Skin check. Lung cancer screening. You may have this screening every year starting at age 75 if you have a 30-pack-year history of smoking and currently smoke or have quit within the past 15 years. Fecal occult blood test (FOBT) of the stool. You may have this test every year starting at age 15. Flexible sigmoidoscopy or colonoscopy. You may have a sigmoidoscopy every 5 years or a colonoscopy every 10 years starting at age 75. Prostate cancer screening. Recommendations will vary depending on your family history and other risks. Hepatitis C blood test. Hepatitis B blood test. Sexually transmitted disease (STD) testing. Diabetes screening. This is done by checking your blood sugar (glucose) after you have not eaten for a while (fasting). You may have this done every 1-3 years. Abdominal aortic aneurysm (AAA) screening. You may need this if you are a current or former smoker. Osteoporosis. You may be screened starting at age 75 if you are at high risk. Talk with your health care provider about your test  results, treatment options, and if necessary, the need for more tests. Vaccines  Your health care provider may recommend certain vaccines, such as: Influenza vaccine. This is recommended every year. Tetanus, diphtheria, and acellular pertussis (Tdap, Td) vaccine. You may need a Td booster every 10 years. Zoster vaccine. You may need this after age 75. Pneumococcal  13-valent conjugate (PCV13) vaccine. One dose is recommended after age 75. Pneumococcal polysaccharide (PPSV23) vaccine. One dose is recommended after age 75. Talk to your health care provider about which screenings and vaccines you need and how often you need them. This information is not intended to replace advice given to you by your health care provider. Make sure you discuss any questions you have with your health care provider. Document Released: 03/23/2015 Document Revised: 11/14/2015 Document Reviewed: 12/26/2014 Elsevier Interactive Patient Education  2017 [place] Prevention in the Home Falls can cause injuries. They can happen to people of all ages. There are many things you can do to make your home safe and to help prevent falls. What can I do on the outside of my home? Regularly fix the edges of walkways and driveways and fix any cracks. Remove anything that might make you trip as you walk through a door, such as a raised step or threshold. Trim any bushes or trees on the path to your home. Use bright outdoor lighting. Clear any walking paths of anything that might make someone trip, such as rocks or tools. Regularly check to see if handrails are loose or broken. Make sure that both sides of any steps have handrails. Any raised decks and porches should have guardrails on the edges. Have any leaves, snow, or ice cleared regularly. Use sand or salt on walking paths during winter. Clean up any spills in your garage right away. This includes oil or grease spills. What can I do in the bathroom? Use night lights. Install grab bars by the toilet and in the tub and shower. Do not use towel bars as grab bars. Use non-skid mats or decals in the tub or shower. If you need to sit down in the shower, use a plastic, non-slip stool. Keep the floor dry. Clean up any water that spills on the floor as soon as it happens. Remove soap buildup in the tub or shower regularly. Attach  bath mats securely with double-sided non-slip rug tape. Do not have throw rugs and other things on the floor that can make you trip. What can I do in the bedroom? Use night lights. Make sure that you have a light by your bed that is easy to reach. Do not use any sheets or blankets that are too big for your bed. They should not hang down onto the floor. Have a firm chair that has side arms. You can use this for support while you get dressed. Do not have throw rugs and other things on the floor that can make you trip. What can I do in the kitchen? Clean up any spills right away. Avoid walking on wet floors. Keep items that you use a lot in easy-to-reach places. If you need to reach something above you, use a strong step stool that has a grab bar. Keep electrical cords out of the way. Do not use floor polish or wax that makes floors slippery. If you must use wax, use non-skid floor wax. Do not have throw rugs and other things on the floor that can make you trip. What can I do with my stairs?  Do not leave any items on the stairs. Make sure that there are handrails on both sides of the stairs and use them. Fix handrails that are broken or loose. Make sure that handrails are as long as the stairways. Check any carpeting to make sure that it is firmly attached to the stairs. Fix any carpet that is loose or worn. Avoid having throw rugs at the top or bottom of the stairs. If you do have throw rugs, attach them to the floor with carpet tape. Make sure that you have a light switch at the top of the stairs and the bottom of the stairs. If you do not have them, ask someone to add them for you. What else can I do to help prevent falls? Wear shoes that: Do not have high heels. Have rubber bottoms. Are comfortable and fit you well. Are closed at the toe. Do not wear sandals. If you use a stepladder: Make sure that it is fully opened. Do not climb a closed stepladder. Make sure that both sides of the  stepladder are locked into place. Ask someone to hold it for you, if possible. Clearly mark and make sure that you can see: Any grab bars or handrails. First and last steps. Where the edge of each step is. Use tools that help you move around (mobility aids) if they are needed. These include: Canes. Walkers. Scooters. Crutches. Turn on the lights when you go into a dark area. Replace any light bulbs as soon as they burn out. Set up your furniture so you have a clear path. Avoid moving your furniture around. If any of your floors are uneven, fix them. If there are any pets around you, be aware of where they are. Review your medicines with your doctor. Some medicines can make you feel dizzy. This can increase your chance of falling. Ask your doctor what other things that you can do to help prevent falls. This information is not intended to replace advice given to you by your health care provider. Make sure you discuss any questions you have with your health care provider. Document Released: 12/21/2008 Document Revised: 08/02/2015 Document Reviewed: 03/31/2014 Elsevier Interactive Patient Education  2017 [employer].

## 2021-09-17 DIAGNOSIS — L110 Acquired keratosis follicularis: Secondary | ICD-10-CM | POA: Diagnosis not present

## 2021-09-17 DIAGNOSIS — L300 Nummular dermatitis: Secondary | ICD-10-CM | POA: Diagnosis not present

## 2021-09-17 DIAGNOSIS — D485 Neoplasm of uncertain behavior of skin: Secondary | ICD-10-CM | POA: Diagnosis not present

## 2021-09-17 DIAGNOSIS — Z85828 Personal history of other malignant neoplasm of skin: Secondary | ICD-10-CM | POA: Diagnosis not present

## 2021-09-17 DIAGNOSIS — L570 Actinic keratosis: Secondary | ICD-10-CM | POA: Diagnosis not present

## 2021-09-30 DIAGNOSIS — M25512 Pain in left shoulder: Secondary | ICD-10-CM | POA: Diagnosis not present

## 2021-10-13 IMAGING — DX DG CERVICAL SPINE COMPLETE 4+V
6 series · 6 of 6 positions shown · non-contrast
Comparison: None.

CLINICAL DATA: Intermittent chronic neck pain

EXAM:
CERVICAL SPINE - COMPLETE 4+ VIEW

[cervical spine lat (1 of 2)]
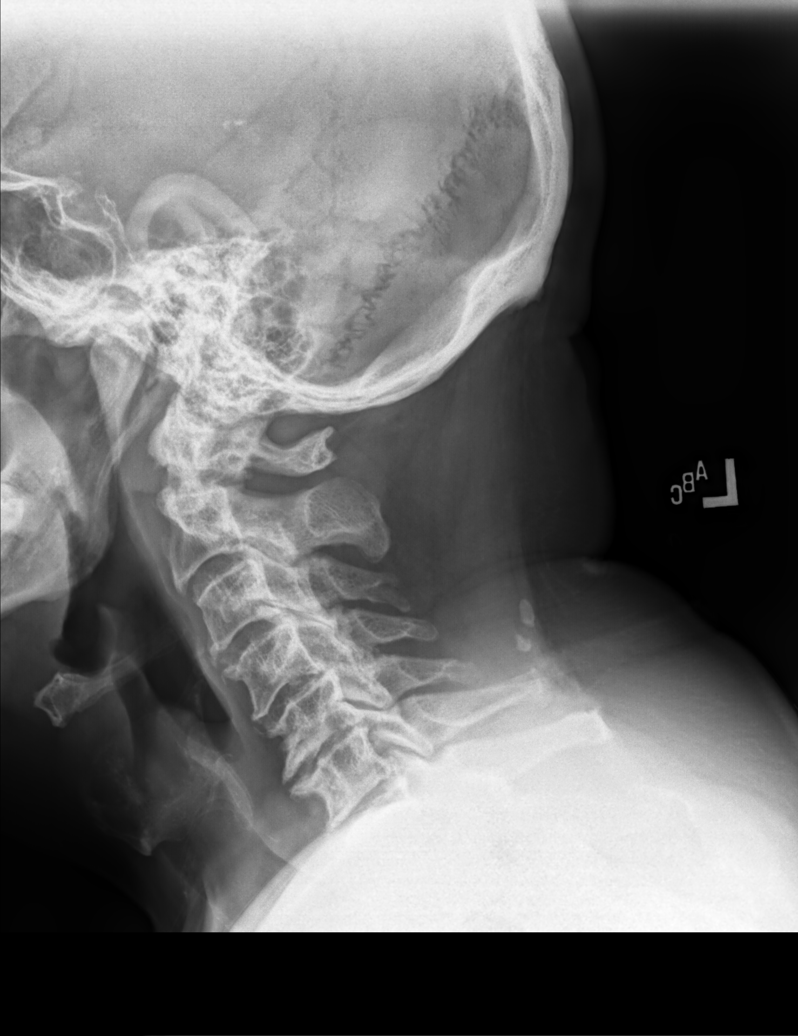

[cervical spine oblique (1 of 2)]
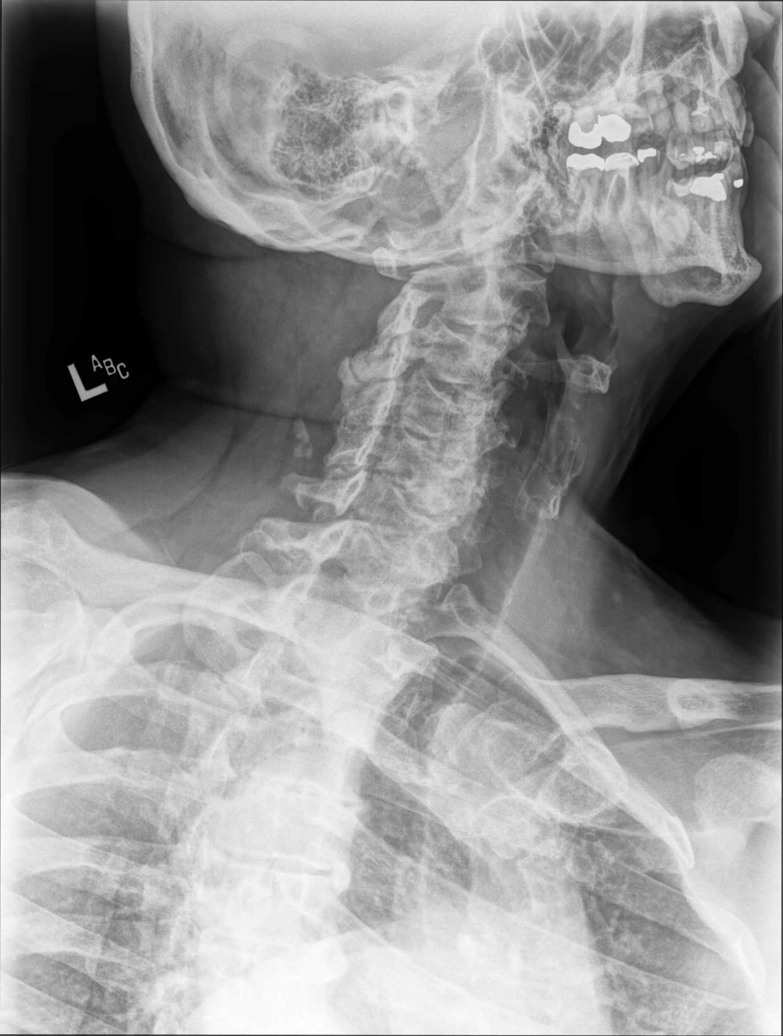

[cervical spine oblique (2 of 2)]
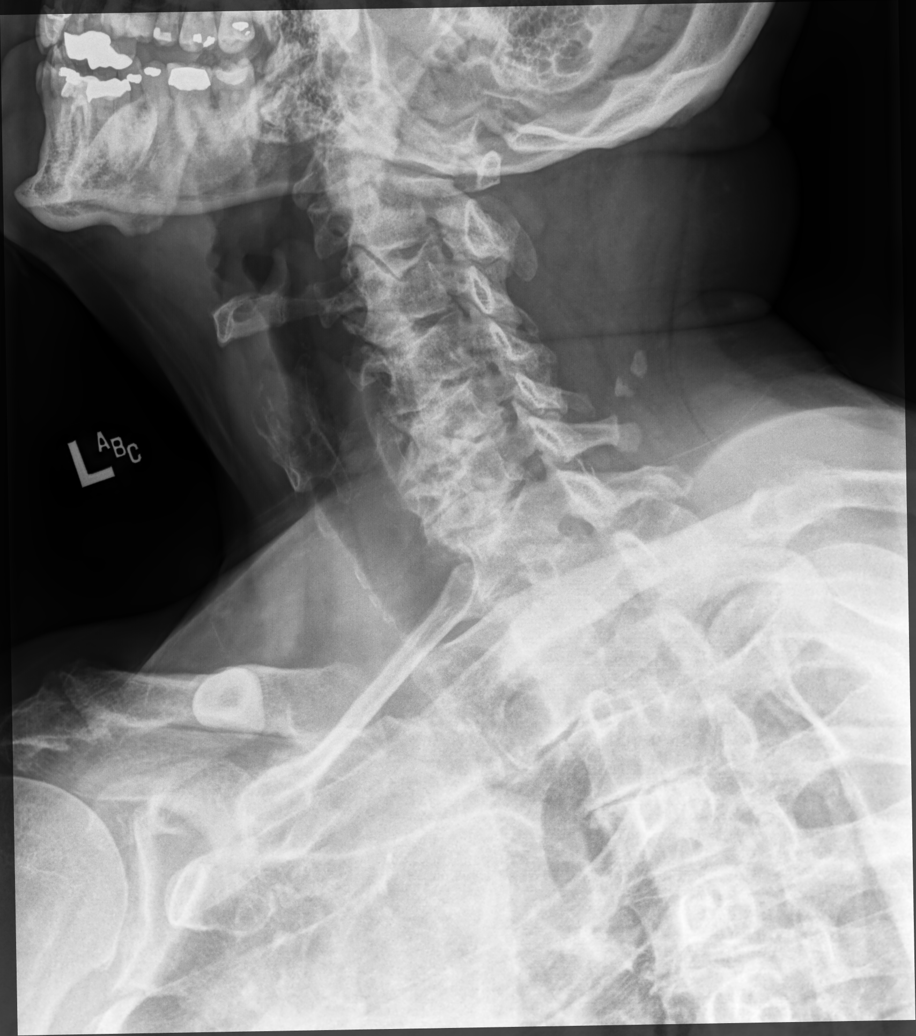

[cervical spine ap (1 of 2)]
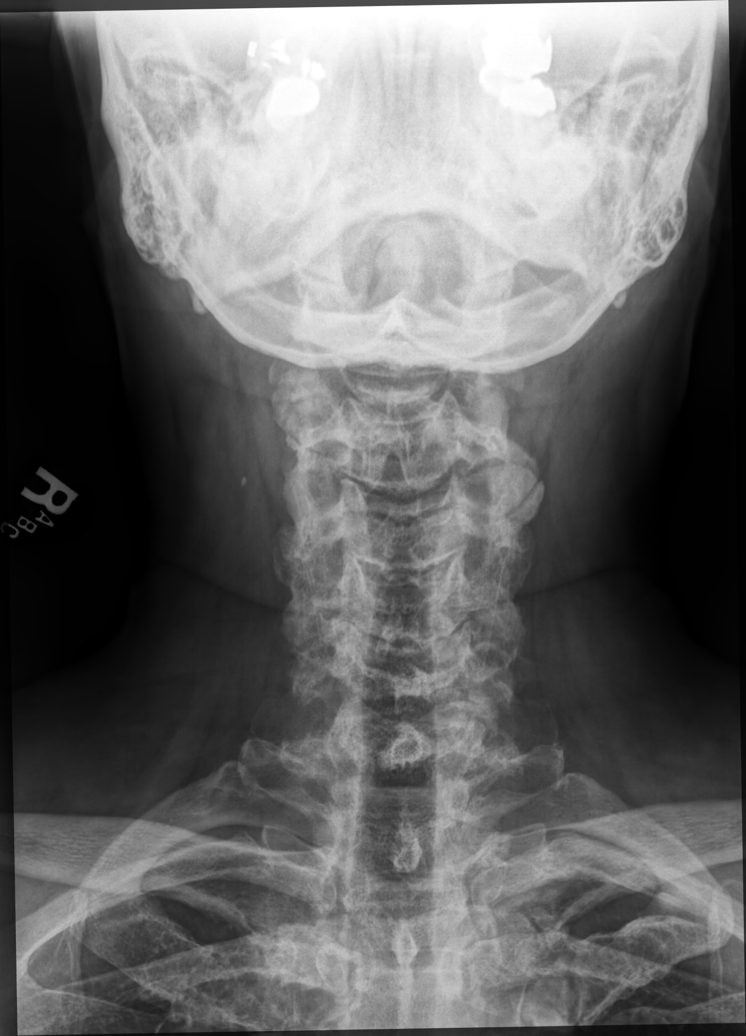

[cervical spine ap (2 of 2)]
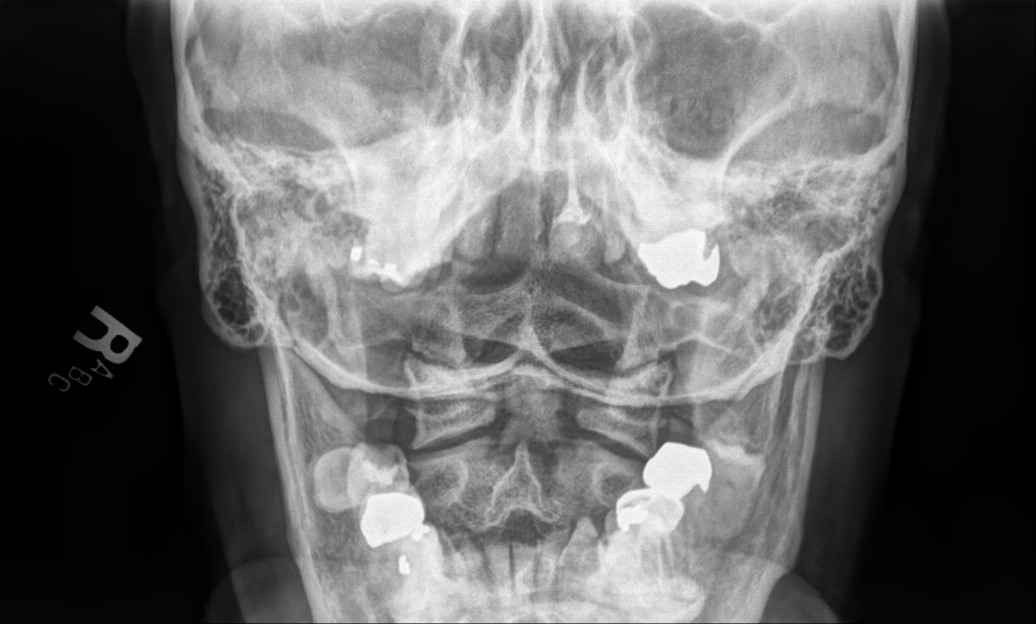

[cervical spine lat (2 of 2)]
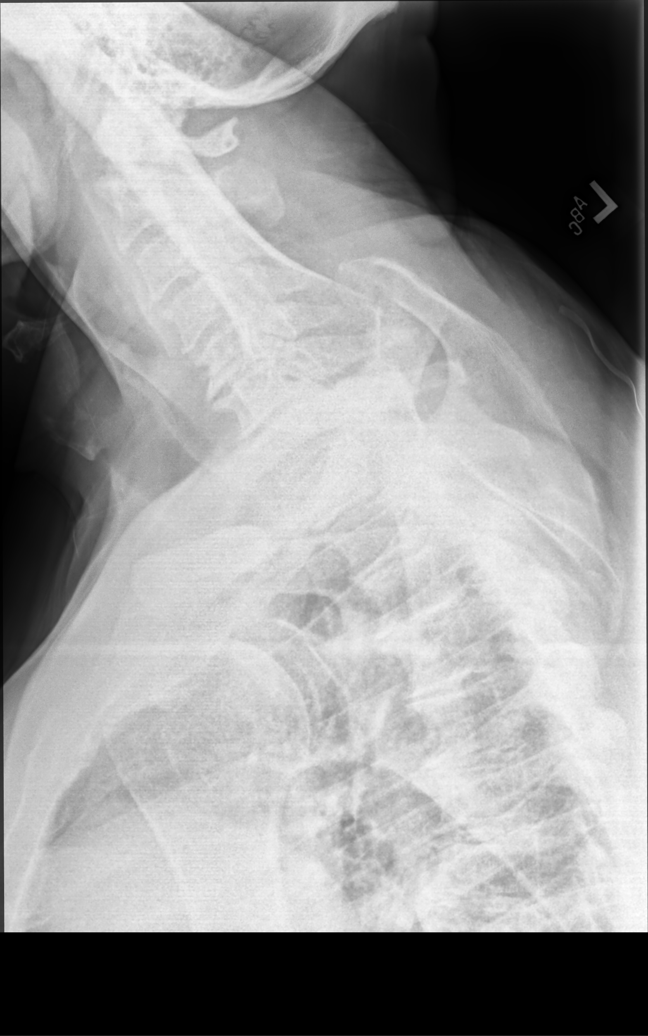

[6 of 6 positions shown; findings below may reference images not displayed]

FINDINGS: Severe diffuse degenerative disc and facet disease. Severe
multilevel bilateral neural foraminal narrowing. No fracture. Normal
alignment. Prevertebral soft tissues are normal.
IMPRESSION: Advanced degenerative disc and facet disease with severe multilevel
bilateral neural foraminal narrowing. No acute bony abnormality.

## 2021-10-18 DIAGNOSIS — H2513 Age-related nuclear cataract, bilateral: Secondary | ICD-10-CM | POA: Diagnosis not present

## 2021-10-18 DIAGNOSIS — H5203 Hypermetropia, bilateral: Secondary | ICD-10-CM | POA: Diagnosis not present

## 2021-10-18 DIAGNOSIS — H52203 Unspecified astigmatism, bilateral: Secondary | ICD-10-CM | POA: Diagnosis not present

## 2021-10-31 DIAGNOSIS — M25551 Pain in right hip: Secondary | ICD-10-CM | POA: Diagnosis not present

## 2021-10-31 DIAGNOSIS — M25651 Stiffness of right hip, not elsewhere classified: Secondary | ICD-10-CM | POA: Diagnosis not present

## 2021-10-31 DIAGNOSIS — R262 Difficulty in walking, not elsewhere classified: Secondary | ICD-10-CM | POA: Diagnosis not present

## 2021-10-31 DIAGNOSIS — R293 Abnormal posture: Secondary | ICD-10-CM | POA: Diagnosis not present

## 2021-11-06 DIAGNOSIS — R293 Abnormal posture: Secondary | ICD-10-CM | POA: Diagnosis not present

## 2021-11-06 DIAGNOSIS — R262 Difficulty in walking, not elsewhere classified: Secondary | ICD-10-CM | POA: Diagnosis not present

## 2021-11-06 DIAGNOSIS — M25651 Stiffness of right hip, not elsewhere classified: Secondary | ICD-10-CM | POA: Diagnosis not present

## 2021-11-06 DIAGNOSIS — M25551 Pain in right hip: Secondary | ICD-10-CM | POA: Diagnosis not present

## 2022-01-09 ENCOUNTER — Encounter: Payer: Self-pay | Admitting: Family Medicine

## 2022-01-09 ENCOUNTER — Ambulatory Visit (INDEPENDENT_AMBULATORY_CARE_PROVIDER_SITE_OTHER): Payer: Medicare HMO | Admitting: Family Medicine

## 2022-01-09 VITALS — BP 130/68 | HR 75 | Temp 97.5°F | Ht 65.5 in | Wt 187.1 lb

## 2022-01-09 DIAGNOSIS — E785 Hyperlipidemia, unspecified: Secondary | ICD-10-CM | POA: Diagnosis not present

## 2022-01-09 DIAGNOSIS — I10 Essential (primary) hypertension: Secondary | ICD-10-CM

## 2022-01-09 MED ORDER — LISINOPRIL 10 MG PO TABS: 10.0000 mg | ORAL_TABLET | Freq: Every day | ORAL | 1 refills | Status: DC

## 2022-01-09 NOTE — Assessment & Plan Note (Addendum)
10 year CVD risk is 26%. We discussed this and he wants to continue his supplements. Will recheck lipid panel today and if it is elevated will talk again about starting a statin.Marland KitchenMarland Kitchen

## 2022-01-09 NOTE — Assessment & Plan Note (Signed)
Current hypertension medications:       Sig   amLODipine (NORVASC) 10 MG tablet (Taking) TAKE 1 TABLET BY MOUTH EVERY DAY   lisinopril (ZESTRIL) 10 MG tablet Take 1 tablet (10 mg total) by mouth daily.      BP is well controlled today in the office, he denies any chest pain, SOB or dizziness, no swelling in the extremities. Will continue the above medications as prescribed. Refilled lisinopril today.

## 2022-01-09 NOTE — Progress Notes (Signed)
Established Patient Office Visit  Subjective   Patient ID: Joel [name], male    DOB: 12-04-1945  Age: 76 y.o. MRN: [id number]  Chief Complaint  Patient presents with   Transitions Of Care    Patient is here for transition of care visit.   HTN -- BP in office performed and is well controlled. He  reports no side effects to the medications, no chest pain, SOB, dizziness or headaches. He has a BP cuff at home and is checking BP regularly, reports they are in the normal range.   HLD -- Patient reports he is taking red yeast rice and omega 3 fatty acids daily. We discussed his numbers from his previous lipid panel, Hdl and TG are very good, while his LDL did slightly increase to 130. Patient states he wants to avoid taking prescription medication if possible. We discussed his 10 year CVD risk score and while it is recommended that he should be on a statin medication, I think that we could wait until next year to recheck his lipid panel.  Elevated PSA -patient sees a urologist regularly. State sthat he has had biopsies in the past without any findings of cancer. States his BPH symptoms are well controlled with the proscar 5 mg daily and the flomax 0.8 mg daily.   Current Outpatient Medications  Medication Instructions   amLODipine (NORVASC) 10 MG tablet TAKE 1 TABLET BY MOUTH EVERY DAY   aspirin 81 mg, Oral, Daily,     CALCIUM PO Oral   cholecalciferol (VITAMIN D3) 1,000 Units, Oral, Daily   CINNAMON PO Oral   co-enzyme Q-10 30 mg, Oral, 3 times daily   finasteride (PROSCAR) 5 mg, Oral, Daily   lisinopril (ZESTRIL) 10 mg, Oral, Daily   Multiple Vitamin (MULTIVITAMIN) capsule 1 capsule, Oral, Daily   Omega 3-6-9 Fatty Acids (OMEGA-3-6-9 PO) Oral   Red Yeast Rice Extract (RED YEAST RICE PO) Oral   tamsulosin (FLOMAX) 0.4 MG CAPS capsule 2 capsules, Oral, Daily    Patient Active Problem List   Diagnosis Date Noted   Elevated PSA 10/27/2013   Dyslipidemia 05/07/2009   Essential  hypertension 05/07/2009   BPH (benign prostatic hyperplasia) 05/07/2009   History of colonic polyps 05/07/2009      Review of Systems  All other systems reviewed and are negative.     Objective:     BP 130/68 (BP Location: Left Arm, Patient Position: Sitting, Cuff Size: Large)   Pulse 75   Temp (!) 97.5 F (36.4 C) (Oral)   Ht 5' 5.5" (1.664 m)   Wt 187 lb 1.6 oz (84.9 kg)   SpO2 98%   BMI 30.66 kg/m  BP Readings from Last 3 Encounters:  01/09/22 130/68  08/15/21 138/66  07/31/21 130/80      Physical Exam Vitals reviewed.  Constitutional:      Appearance: Normal appearance. He is well-groomed and normal weight.  Eyes:     Extraocular Movements: Extraocular movements intact.     Conjunctiva/sclera: Conjunctivae normal.  Neck:     Thyroid: No thyromegaly.  Cardiovascular:     Rate and Rhythm: Normal rate and regular rhythm.     Heart sounds: S1 normal and S2 normal. No murmur heard. Pulmonary:     Effort: Pulmonary effort is normal.     Breath sounds: Normal breath sounds and air entry. No rales.  Abdominal:     General: Abdomen is flat. Bowel sounds are normal.  Musculoskeletal:     Right lower  leg: No edema.     Left lower leg: No edema.  Neurological:     General: No focal deficit present.     Mental Status: He is alert and oriented to person, place, and time.     Gait: Gait is intact.  Psychiatric:        Mood and Affect: Mood and affect normal.      No results found for any visits on 01/09/22.    The 10-year ASCVD risk score (Arnett DK, et al., 2019) is: 26.7%    Assessment & Plan:   Problem List Items Addressed This Visit       Cardiovascular and Mediastinum   Essential hypertension    Current hypertension medications:       Sig   amLODipine (NORVASC) 10 MG tablet (Taking) TAKE 1 TABLET BY MOUTH EVERY DAY   lisinopril (ZESTRIL) 10 MG tablet Take 1 tablet (10 mg total) by mouth daily.     BP is well controlled today in the office, he  denies any chest pain, SOB or dizziness, no swelling in the extremities. Will continue the above medications as prescribed. Refilled lisinopril today.      Relevant Medications   lisinopril (ZESTRIL) 10 MG tablet   Other Relevant Orders   CMP   TSH     Other   Dyslipidemia - Primary    10 year CVD risk is 26%. We discussed this and he wants to continue his supplements. Will recheck lipid panel today and if it is elevated will talk again about starting a statin...      Relevant Orders   Lipid Panel    Return in about 7 months (around 08/04/2022) for Annual Physical Exam.    Farrel Conners, MD

## 2022-01-20 DIAGNOSIS — D3617 Benign neoplasm of peripheral nerves and autonomic nervous system of trunk, unspecified: Secondary | ICD-10-CM | POA: Diagnosis not present

## 2022-01-20 DIAGNOSIS — L570 Actinic keratosis: Secondary | ICD-10-CM | POA: Diagnosis not present

## 2022-01-20 DIAGNOSIS — D485 Neoplasm of uncertain behavior of skin: Secondary | ICD-10-CM | POA: Diagnosis not present

## 2022-01-20 DIAGNOSIS — Z85828 Personal history of other malignant neoplasm of skin: Secondary | ICD-10-CM | POA: Diagnosis not present

## 2022-01-20 DIAGNOSIS — L821 Other seborrheic keratosis: Secondary | ICD-10-CM | POA: Diagnosis not present

## 2022-02-26 DIAGNOSIS — M25551 Pain in right hip: Secondary | ICD-10-CM | POA: Diagnosis not present

## 2022-02-26 DIAGNOSIS — R293 Abnormal posture: Secondary | ICD-10-CM | POA: Diagnosis not present

## 2022-02-26 DIAGNOSIS — R262 Difficulty in walking, not elsewhere classified: Secondary | ICD-10-CM | POA: Diagnosis not present

## 2022-02-26 DIAGNOSIS — M25651 Stiffness of right hip, not elsewhere classified: Secondary | ICD-10-CM | POA: Diagnosis not present

## 2022-02-28 DIAGNOSIS — R262 Difficulty in walking, not elsewhere classified: Secondary | ICD-10-CM | POA: Diagnosis not present

## 2022-02-28 DIAGNOSIS — M25551 Pain in right hip: Secondary | ICD-10-CM | POA: Diagnosis not present

## 2022-02-28 DIAGNOSIS — M25651 Stiffness of right hip, not elsewhere classified: Secondary | ICD-10-CM | POA: Diagnosis not present

## 2022-02-28 DIAGNOSIS — R293 Abnormal posture: Secondary | ICD-10-CM | POA: Diagnosis not present

## 2022-03-07 DIAGNOSIS — M79621 Pain in right upper arm: Secondary | ICD-10-CM | POA: Diagnosis not present

## 2022-03-07 DIAGNOSIS — M75111 Incomplete rotator cuff tear or rupture of right shoulder, not specified as traumatic: Secondary | ICD-10-CM | POA: Diagnosis not present

## 2022-03-24 DIAGNOSIS — M25511 Pain in right shoulder: Secondary | ICD-10-CM | POA: Diagnosis not present

## 2022-03-30 DIAGNOSIS — G540 Brachial plexus disorders: Secondary | ICD-10-CM | POA: Diagnosis not present

## 2022-04-01 ENCOUNTER — Other Ambulatory Visit: Payer: Self-pay | Admitting: *Deleted

## 2022-04-02 MED ORDER — AMLODIPINE BESYLATE 10 MG PO TABS: 10.0000 mg | ORAL_TABLET | Freq: Every day | ORAL | 2 refills | Status: DC

## 2022-04-09 DIAGNOSIS — G540 Brachial plexus disorders: Secondary | ICD-10-CM | POA: Diagnosis not present

## 2022-04-15 DIAGNOSIS — M75111 Incomplete rotator cuff tear or rupture of right shoulder, not specified as traumatic: Secondary | ICD-10-CM | POA: Diagnosis not present

## 2022-04-15 DIAGNOSIS — M79621 Pain in right upper arm: Secondary | ICD-10-CM | POA: Diagnosis not present

## 2022-04-21 DIAGNOSIS — M79621 Pain in right upper arm: Secondary | ICD-10-CM | POA: Diagnosis not present

## 2022-04-21 DIAGNOSIS — M75111 Incomplete rotator cuff tear or rupture of right shoulder, not specified as traumatic: Secondary | ICD-10-CM | POA: Diagnosis not present

## 2022-04-28 DIAGNOSIS — M79621 Pain in right upper arm: Secondary | ICD-10-CM | POA: Diagnosis not present

## 2022-04-28 DIAGNOSIS — M75111 Incomplete rotator cuff tear or rupture of right shoulder, not specified as traumatic: Secondary | ICD-10-CM | POA: Diagnosis not present

## 2022-05-05 DIAGNOSIS — M79621 Pain in right upper arm: Secondary | ICD-10-CM | POA: Diagnosis not present

## 2022-05-05 DIAGNOSIS — M75111 Incomplete rotator cuff tear or rupture of right shoulder, not specified as traumatic: Secondary | ICD-10-CM | POA: Diagnosis not present

## 2022-05-12 DIAGNOSIS — M79621 Pain in right upper arm: Secondary | ICD-10-CM | POA: Diagnosis not present

## 2022-05-12 DIAGNOSIS — M75111 Incomplete rotator cuff tear or rupture of right shoulder, not specified as traumatic: Secondary | ICD-10-CM | POA: Diagnosis not present

## 2022-05-19 DIAGNOSIS — M79621 Pain in right upper arm: Secondary | ICD-10-CM | POA: Diagnosis not present

## 2022-05-19 DIAGNOSIS — M75111 Incomplete rotator cuff tear or rupture of right shoulder, not specified as traumatic: Secondary | ICD-10-CM | POA: Diagnosis not present

## 2022-05-21 DIAGNOSIS — G540 Brachial plexus disorders: Secondary | ICD-10-CM | POA: Diagnosis not present

## 2022-05-22 DIAGNOSIS — M75111 Incomplete rotator cuff tear or rupture of right shoulder, not specified as traumatic: Secondary | ICD-10-CM | POA: Diagnosis not present

## 2022-05-22 DIAGNOSIS — M79621 Pain in right upper arm: Secondary | ICD-10-CM | POA: Diagnosis not present

## 2022-06-02 DIAGNOSIS — M79621 Pain in right upper arm: Secondary | ICD-10-CM | POA: Diagnosis not present

## 2022-06-02 DIAGNOSIS — M75111 Incomplete rotator cuff tear or rupture of right shoulder, not specified as traumatic: Secondary | ICD-10-CM | POA: Diagnosis not present

## 2022-06-09 DIAGNOSIS — M75111 Incomplete rotator cuff tear or rupture of right shoulder, not specified as traumatic: Secondary | ICD-10-CM | POA: Diagnosis not present

## 2022-06-09 DIAGNOSIS — M79621 Pain in right upper arm: Secondary | ICD-10-CM | POA: Diagnosis not present

## 2022-06-23 DIAGNOSIS — M79621 Pain in right upper arm: Secondary | ICD-10-CM | POA: Diagnosis not present

## 2022-06-23 DIAGNOSIS — M75111 Incomplete rotator cuff tear or rupture of right shoulder, not specified as traumatic: Secondary | ICD-10-CM | POA: Diagnosis not present

## 2022-06-30 DIAGNOSIS — M75111 Incomplete rotator cuff tear or rupture of right shoulder, not specified as traumatic: Secondary | ICD-10-CM | POA: Diagnosis not present

## 2022-06-30 DIAGNOSIS — M79621 Pain in right upper arm: Secondary | ICD-10-CM | POA: Diagnosis not present

## 2022-07-07 DIAGNOSIS — M75111 Incomplete rotator cuff tear or rupture of right shoulder, not specified as traumatic: Secondary | ICD-10-CM | POA: Diagnosis not present

## 2022-07-07 DIAGNOSIS — M79621 Pain in right upper arm: Secondary | ICD-10-CM | POA: Diagnosis not present

## 2022-07-14 DIAGNOSIS — M79621 Pain in right upper arm: Secondary | ICD-10-CM | POA: Diagnosis not present

## 2022-07-14 DIAGNOSIS — M75111 Incomplete rotator cuff tear or rupture of right shoulder, not specified as traumatic: Secondary | ICD-10-CM | POA: Diagnosis not present

## 2022-07-21 DIAGNOSIS — M79621 Pain in right upper arm: Secondary | ICD-10-CM | POA: Diagnosis not present

## 2022-07-21 DIAGNOSIS — M75111 Incomplete rotator cuff tear or rupture of right shoulder, not specified as traumatic: Secondary | ICD-10-CM | POA: Diagnosis not present

## 2022-07-23 DIAGNOSIS — M5412 Radiculopathy, cervical region: Secondary | ICD-10-CM | POA: Diagnosis not present

## 2022-07-23 DIAGNOSIS — M25511 Pain in right shoulder: Secondary | ICD-10-CM | POA: Diagnosis not present

## 2022-07-23 DIAGNOSIS — G540 Brachial plexus disorders: Secondary | ICD-10-CM | POA: Diagnosis not present

## 2022-07-28 DIAGNOSIS — R3915 Urgency of urination: Secondary | ICD-10-CM | POA: Diagnosis not present

## 2022-07-29 DIAGNOSIS — L821 Other seborrheic keratosis: Secondary | ICD-10-CM | POA: Diagnosis not present

## 2022-07-29 DIAGNOSIS — Z85828 Personal history of other malignant neoplasm of skin: Secondary | ICD-10-CM | POA: Diagnosis not present

## 2022-07-29 DIAGNOSIS — L570 Actinic keratosis: Secondary | ICD-10-CM | POA: Diagnosis not present

## 2022-07-29 DIAGNOSIS — L309 Dermatitis, unspecified: Secondary | ICD-10-CM | POA: Diagnosis not present

## 2022-07-29 DIAGNOSIS — L300 Nummular dermatitis: Secondary | ICD-10-CM | POA: Diagnosis not present

## 2022-07-29 DIAGNOSIS — L910 Hypertrophic scar: Secondary | ICD-10-CM | POA: Diagnosis not present

## 2022-07-29 DIAGNOSIS — L738 Other specified follicular disorders: Secondary | ICD-10-CM | POA: Diagnosis not present

## 2022-08-06 ENCOUNTER — Other Ambulatory Visit (INDEPENDENT_AMBULATORY_CARE_PROVIDER_SITE_OTHER): Payer: Medicare HMO

## 2022-08-06 DIAGNOSIS — I10 Essential (primary) hypertension: Secondary | ICD-10-CM | POA: Diagnosis not present

## 2022-08-06 DIAGNOSIS — E785 Hyperlipidemia, unspecified: Secondary | ICD-10-CM

## 2022-08-06 LAB — COMPREHENSIVE METABOLIC PANEL
ALT: 21 U/L (ref 0–53)
AST: 21 U/L (ref 0–37)
Albumin: 4.3 g/dL (ref 3.5–5.2)
Alkaline Phosphatase: 64 U/L (ref 39–117)
BUN: 23 mg/dL (ref 6–23)
CO2: 28 mEq/L (ref 19–32)
Calcium: 9.5 mg/dL (ref 8.4–10.5)
Chloride: 102 mEq/L (ref 96–112)
Creatinine, Ser: 1.07 mg/dL (ref 0.40–1.50)
GFR: 67.6 mL/min (ref >60.00)
Glucose, Bld: 91 mg/dL (ref 70–99)
Potassium: 4.3 mEq/L (ref 3.5–5.1)
Sodium: 139 mEq/L (ref 135–145)
Total Bilirubin: 0.5 mg/dL (ref 0.2–1.2)
Total Protein: 7.6 g/dL (ref 6.0–8.3)

## 2022-08-06 LAB — LIPID PANEL
Cholesterol: 214 mg/dL — ABNORMAL HIGH (ref 0–200)
HDL: 73 mg/dL (ref >39.00)
LDL Cholesterol: 123 mg/dL — ABNORMAL HIGH (ref 0–99)
NonHDL: 140.69
Total CHOL/HDL Ratio: 3
Triglycerides: 88 mg/dL (ref 0.0–149.0)
VLDL: 17.6 mg/dL (ref 0.0–40.0)

## 2022-08-06 LAB — TSH: TSH: 2.3 u[IU]/mL (ref 0.35–5.50)

## 2022-08-11 ENCOUNTER — Encounter: Payer: Self-pay | Admitting: Family Medicine

## 2022-08-11 ENCOUNTER — Ambulatory Visit (INDEPENDENT_AMBULATORY_CARE_PROVIDER_SITE_OTHER): Payer: Medicare HMO | Admitting: Family Medicine

## 2022-08-11 VITALS — BP 112/68 | HR 73 | Temp 98.1°F | Ht 65.0 in | Wt 186.7 lb

## 2022-08-11 DIAGNOSIS — E785 Hyperlipidemia, unspecified: Secondary | ICD-10-CM | POA: Diagnosis not present

## 2022-08-11 DIAGNOSIS — Z0000 Encounter for general adult medical examination without abnormal findings: Secondary | ICD-10-CM

## 2022-08-11 MED ORDER — SIMVASTATIN 20 MG PO TABS: 20.0000 mg | ORAL_TABLET | Freq: Every day | ORAL | 0 refills | Status: DC

## 2022-08-11 NOTE — Patient Instructions (Signed)
Health Maintenance, Male Adopting a healthy lifestyle and getting preventive care are important in promoting health and wellness. Ask your health care provider about: The right schedule for you to have regular tests and exams. Things you can do on your own to prevent diseases and keep yourself healthy. What should I know about diet, weight, and exercise? Eat a healthy diet  Eat a diet that includes plenty of vegetables, fruits, low-fat dairy products, and lean protein. Do not eat a lot of foods that are high in solid fats, added sugars, or sodium. Maintain a healthy weight Body mass index (BMI) is a measurement that can be used to identify possible weight problems. It estimates body fat based on height and weight. Your health care provider can help determine your BMI and help you achieve or maintain a healthy weight. Get regular exercise Get regular exercise. This is one of the most important things you can do for your health. Most adults should: Exercise for at least 150 minutes each week. The exercise should increase your heart rate and make you sweat (moderate-intensity exercise). Do strengthening exercises at least twice a week. This is in addition to the moderate-intensity exercise. Spend less time sitting. Even light physical activity can be beneficial. Watch cholesterol and blood lipids Have your blood tested for lipids and cholesterol at 76 years of age, then have this test every 5 years. You may need to have your cholesterol levels checked more often if: Your lipid or cholesterol levels are high. You are older than 76 years of age. You are at high risk for heart disease. What should I know about cancer screening? Many types of cancers can be detected early and may often be prevented. Depending on your health history and family history, you may need to have cancer screening at various ages. This may include screening for: Colorectal cancer. Prostate cancer. Skin cancer. Lung  cancer. What should I know about heart disease, diabetes, and high blood pressure? Blood pressure and heart disease High blood pressure causes heart disease and increases the risk of stroke. This is more likely to develop in people who have high blood pressure readings or are overweight. Talk with your health care provider about your target blood pressure readings. Have your blood pressure checked: Every 3-5 years if you are 18-39 years of age. Every year if you are 40 years old or older. If you are between the ages of 65 and 75 and are a current or former smoker, ask your health care provider if you should have a one-time screening for abdominal aortic aneurysm (AAA). Diabetes Have regular diabetes screenings. This checks your fasting blood sugar level. Have the screening done: Once every three years after age 45 if you are at a normal weight and have a low risk for diabetes. More often and at a younger age if you are overweight or have a high risk for diabetes. What should I know about preventing infection? Hepatitis B If you have a higher risk for hepatitis B, you should be screened for this virus. Talk with your health care provider to find out if you are at risk for hepatitis B infection. Hepatitis C Blood testing is recommended for: Everyone born from 1945 through 1965. Anyone with known risk factors for hepatitis C. Sexually transmitted infections (STIs) You should be screened each year for STIs, including gonorrhea and chlamydia, if: You are sexually active and are younger than 76 years of age. You are older than 76 years of age and your   health care provider tells you that you are at risk for this type of infection. Your sexual activity has changed since you were last screened, and you are at increased risk for chlamydia or gonorrhea. Ask your health care provider if you are at risk. Ask your health care provider about whether you are at high risk for HIV. Your health care provider  may recommend a prescription medicine to help prevent HIV infection. If you choose to take medicine to prevent HIV, you should first get tested for HIV. You should then be tested every 3 months for as long as you are taking the medicine. Follow these instructions at home: Alcohol use Do not drink alcohol if your health care provider tells you not to drink. If you drink alcohol: Limit how much you have to 0-2 drinks a day. Know how much alcohol is in your drink. In the [place] one drink equals one 12 oz bottle of beer (355 mL), one 5 oz glass of wine (148 mL), or one 1 oz glass of hard liquor (44 mL). Lifestyle Do not use any products that contain nicotine or tobacco. These products include cigarettes, chewing tobacco, and vaping devices, such as e-cigarettes. If you need help quitting, ask your health care provider. Do not use street drugs. Do not share needles. Ask your health care provider for help if you need support or information about quitting drugs. General instructions Schedule regular health, dental, and eye exams. Stay current with your vaccines. Tell your health care provider if: You often feel depressed. You have ever been abused or do not feel safe at home. Summary Adopting a healthy lifestyle and getting preventive care are important in promoting health and wellness. Follow your health care provider's instructions about healthy diet, exercising, and getting tested or screened for diseases. Follow your health care provider's instructions on monitoring your cholesterol and blood pressure. This information is not intended to replace advice given to you by your health care provider. Make sure you discuss any questions you have with your health care provider. Document Revised: 07/16/2020 Document Reviewed: 07/16/2020 Elsevier Patient Education  2024 [employer]  

## 2022-08-11 NOTE — Progress Notes (Signed)
Complete physical exam  Patient: Joel [name]   DOB: [birth year]   77 y.o. Male  MRN: [id number]  Subjective:    Chief Complaint  Patient presents with   Annual Exam    Joel [name] is a 77 y.o. male who presents today for a complete physical exam. He reports consuming a general diet. Gym/ health club routine includes cardio, weights about 3 times per week. He generally feels well. He reports sleeping well. He does not have additional problems to discuss today.   We reviewed his blood work and had a conversation about adding a statin, the risks/benefits, he is agreeable to trying a statin.   Most recent fall risk assessment:    08/11/2022   12:55 PM  Fall Risk   Falls in the past year? 0  Number falls in past yr: 0  Injury with Fall? 0  Risk for fall due to : No Fall Risks  Follow up Falls evaluation completed     Most recent depression screenings:    08/11/2022   12:55 PM 01/09/2022    3:06 PM  PHQ 2/9 Scores  PHQ - 2 Score 0 0    Vision:Within last year and Dental: No current dental problems and Receives regular dental care  Patient Active Problem List   Diagnosis Date Noted   Elevated PSA 10/27/2013   Dyslipidemia 05/07/2009   Essential hypertension 05/07/2009   BPH (benign prostatic hyperplasia) 05/07/2009   History of colonic polyps 05/07/2009      Patient Care Team: Karie Georges, MD as PCP - General (Family Medicine) Marcine Matar, MD as Consulting Physician (Urology) [place], Amy, MD as Consulting Physician (Dermatology)   Outpatient Medications Prior to Visit  Medication Sig   amLODipine (NORVASC) 10 MG tablet Take 1 tablet (10 mg total) by mouth daily.   aspirin 81 MG tablet Take 81 mg by mouth daily.   CALCIUM PO Take by mouth.   cholecalciferol (VITAMIN D3) 25 MCG (1000 UNIT) tablet Take 1,000 Units by mouth daily.   CINNAMON PO Take by mouth.   co-enzyme Q-10 30 MG capsule Take 30 mg by mouth 3 (three) times daily.   finasteride  (PROSCAR) 5 MG tablet Take 5 mg by mouth daily.   lisinopril (ZESTRIL) 10 MG tablet Take 1 tablet (10 mg total) by mouth daily.   Multiple Vitamin (MULTIVITAMIN) capsule Take 1 capsule by mouth daily.   Omega 3-6-9 Fatty Acids (OMEGA-3-6-9 PO) Take by mouth.   tamsulosin (FLOMAX) 0.4 MG CAPS capsule Take 2 capsules by mouth daily.   [DISCONTINUED] Red Yeast Rice Extract (RED YEAST RICE PO) Take by mouth.   No facility-administered medications prior to visit.    Review of Systems  HENT:  Negative for hearing loss.   Eyes:  Negative for blurred vision.  Respiratory:  Negative for shortness of breath.   Cardiovascular:  Negative for chest pain.  Gastrointestinal: Negative.   Genitourinary: Negative.   Musculoskeletal:  Negative for back pain.  Neurological:  Negative for headaches.  Psychiatric/Behavioral:  Negative for depression.       The 10-year ASCVD risk score (Arnett DK, et al., 2019) is: 22.4%   Values used to calculate the score:     Age: 31 years     Sex: Male     Is Non-Hispanic African American: No     Diabetic: No     Tobacco smoker: No     Systolic Blood Pressure: 112 mmHg     Is  BP treated: Yes     HDL Cholesterol: 73 mg/dL     Total Cholesterol: 214 mg/dL  Objective:     BP 045/40 (BP Location: Left Arm, Patient Position: Sitting, Cuff Size: Normal)   Pulse 73   Temp 98.1 F (36.7 C) (Oral)   Ht 5\' 5"  (1.651 m)   Wt 186 lb 11.2 oz (84.7 kg)   SpO2 98%   BMI 31.07 kg/m    Physical Exam Vitals reviewed.  Constitutional:      Appearance: Normal appearance. He is well-groomed and normal weight.  HENT:     Right Ear: Tympanic membrane normal.     Left Ear: Tympanic membrane normal.     Mouth/Throat:     Mouth: Mucous membranes are moist.     Pharynx: No posterior oropharyngeal erythema.  Eyes:     Extraocular Movements: Extraocular movements intact.     Conjunctiva/sclera: Conjunctivae normal.  Neck:     Thyroid: No thyromegaly.  Cardiovascular:      Rate and Rhythm: Normal rate and regular rhythm.     Heart sounds: S1 normal and S2 normal. No murmur heard. Pulmonary:     Effort: Pulmonary effort is normal.     Breath sounds: Normal breath sounds and air entry. No rales.  Abdominal:     General: Abdomen is flat. Bowel sounds are normal.  Musculoskeletal:     Right lower leg: No edema.     Left lower leg: No edema.  Lymphadenopathy:     Cervical: No cervical adenopathy.  Neurological:     General: No focal deficit present.     Mental Status: He is alert and oriented to person, place, and time.     Gait: Gait is intact.  Psychiatric:        Mood and Affect: Mood and affect normal.      No results found for any visits on 08/11/22. Last metabolic panel Lab Results  Component Value Date   GLUCOSE 91 08/06/2022   NA 139 08/06/2022   K 4.3 08/06/2022   CL 102 08/06/2022   CO2 28 08/06/2022   BUN 23 08/06/2022   CREATININE 1.07 08/06/2022   GFRNONAA >60 09/05/2014   CALCIUM 9.5 08/06/2022   PROT 7.6 08/06/2022   ALBUMIN 4.3 08/06/2022   BILITOT 0.5 08/06/2022   ALKPHOS 64 08/06/2022   AST 21 08/06/2022   ALT 21 08/06/2022   ANIONGAP 7 09/05/2014   Last lipids Lab Results  Component Value Date   CHOL 214 (H) 08/06/2022   HDL 73.00 08/06/2022   LDLCALC 123 (H) 08/06/2022   LDLDIRECT 137.6 09/17/2012   TRIG 88.0 08/06/2022   CHOLHDL 3 08/06/2022   Last thyroid functions Lab Results  Component Value Date   TSH 2.30 08/06/2022        Assessment & Plan:    Routine Health Maintenance and Physical Exam  Immunization History  Administered Date(s) Administered   Fluad Quad(high Dose 65+) 12/14/2018   Influenza Split 01/01/2011   Influenza Whole 01/08/2010   Influenza, High Dose Seasonal PF 01/13/2014, 02/22/2016, 01/11/2018   Influenza,inj,Quad PF,6+ Mos 03/20/2015   Influenza-Unspecified 02/22/2016, 01/28/2017, 12/14/2018, 12/20/2020, 12/13/2021   PFIZER(Purple Top)SARS-COV-2 Vaccination 04/07/2019,  05/05/2019, 12/13/2019, 04/10/2020   PNEUMOCOCCAL CONJUGATE-20 08/27/2021   Pfizer Covid-19 Vaccine Bivalent Booster 38yrs & up 03/18/2021, 08/27/2021, 12/13/2021   Pneumococcal Conjugate-13 10/27/2013   Pneumococcal Polysaccharide-23 09/24/2012   Td 01/08/2010   Tdap 08/08/2020   Zoster Recombinat (Shingrix) 09/22/2018, 12/14/2018   Zoster, Live 01/08/2010  Health Maintenance  Topic Date Due   Medicare Annual Wellness (AWV)  08/23/2022   COVID-19 Vaccine (8 - 2023-24 season) 08/27/2022 (Originally 02/07/2022)   INFLUENZA VACCINE  10/09/2022   DTaP/Tdap/Td (3 - Td or Tdap) 08/09/2030   Pneumonia Vaccine 23+ Years old  Completed   Hepatitis C Screening  Completed   Zoster Vaccines- Shingrix  Completed   HPV VACCINES  Aged Out   Colonoscopy  Discontinued    Discussed health benefits of physical activity, and encouraged him to engage in regular exercise appropriate for his age and condition.  Dyslipidemia -     Simvastatin; Take 1 tablet (20 mg total) by mouth at bedtime.  Dispense: 90 tablet; Refill: 0  Routine general medical examination at a health care facility  Normal physical exam findings today, we discussed cardiac risk today and I started simvastatin 20 mg daily. Will see back in 6 months to repeat lipid panel and CMP. Handouts on healthy eating given to patient.   Return in about 6 months (around 02/10/2023) for HTN.     Karie Georges, MD

## 2022-08-14 DIAGNOSIS — M542 Cervicalgia: Secondary | ICD-10-CM | POA: Diagnosis not present

## 2022-08-14 DIAGNOSIS — M25511 Pain in right shoulder: Secondary | ICD-10-CM | POA: Diagnosis not present

## 2022-08-20 DIAGNOSIS — G540 Brachial plexus disorders: Secondary | ICD-10-CM | POA: Diagnosis not present

## 2022-08-20 DIAGNOSIS — M5412 Radiculopathy, cervical region: Secondary | ICD-10-CM | POA: Diagnosis not present

## 2022-08-26 ENCOUNTER — Encounter: Payer: Self-pay | Admitting: Family Medicine

## 2022-09-05 ENCOUNTER — Other Ambulatory Visit: Payer: Self-pay | Admitting: Family Medicine

## 2022-09-05 DIAGNOSIS — I10 Essential (primary) hypertension: Secondary | ICD-10-CM

## 2022-09-09 DIAGNOSIS — M5412 Radiculopathy, cervical region: Secondary | ICD-10-CM | POA: Diagnosis not present

## 2022-09-16 DIAGNOSIS — M79601 Pain in right arm: Secondary | ICD-10-CM | POA: Diagnosis not present

## 2022-09-16 DIAGNOSIS — M542 Cervicalgia: Secondary | ICD-10-CM | POA: Diagnosis not present

## 2022-09-28 DIAGNOSIS — M6281 Muscle weakness (generalized): Secondary | ICD-10-CM | POA: Diagnosis not present

## 2022-10-01 DIAGNOSIS — M25561 Pain in right knee: Secondary | ICD-10-CM | POA: Diagnosis not present

## 2022-10-07 DIAGNOSIS — M542 Cervicalgia: Secondary | ICD-10-CM | POA: Diagnosis not present

## 2022-10-10 DIAGNOSIS — N400 Enlarged prostate without lower urinary tract symptoms: Secondary | ICD-10-CM | POA: Diagnosis not present

## 2022-10-10 DIAGNOSIS — Z973 Presence of spectacles and contact lenses: Secondary | ICD-10-CM | POA: Diagnosis not present

## 2022-10-10 DIAGNOSIS — E669 Obesity, unspecified: Secondary | ICD-10-CM | POA: Diagnosis not present

## 2022-10-10 DIAGNOSIS — Z8249 Family history of ischemic heart disease and other diseases of the circulatory system: Secondary | ICD-10-CM | POA: Diagnosis not present

## 2022-10-10 DIAGNOSIS — E785 Hyperlipidemia, unspecified: Secondary | ICD-10-CM | POA: Diagnosis not present

## 2022-10-10 DIAGNOSIS — Z6830 Body mass index (BMI) 30.0-30.9, adult: Secondary | ICD-10-CM | POA: Diagnosis not present

## 2022-10-10 DIAGNOSIS — I10 Essential (primary) hypertension: Secondary | ICD-10-CM | POA: Diagnosis not present

## 2022-10-10 DIAGNOSIS — Z823 Family history of stroke: Secondary | ICD-10-CM | POA: Diagnosis not present

## 2022-10-10 DIAGNOSIS — Z7982 Long term (current) use of aspirin: Secondary | ICD-10-CM | POA: Diagnosis not present

## 2022-10-10 DIAGNOSIS — M25561 Pain in right knee: Secondary | ICD-10-CM | POA: Diagnosis not present

## 2022-10-13 DIAGNOSIS — G540 Brachial plexus disorders: Secondary | ICD-10-CM | POA: Diagnosis not present

## 2022-10-13 DIAGNOSIS — Z6831 Body mass index (BMI) 31.0-31.9, adult: Secondary | ICD-10-CM | POA: Diagnosis not present

## 2022-10-14 DIAGNOSIS — H43813 Vitreous degeneration, bilateral: Secondary | ICD-10-CM | POA: Diagnosis not present

## 2022-10-14 DIAGNOSIS — H2513 Age-related nuclear cataract, bilateral: Secondary | ICD-10-CM | POA: Diagnosis not present

## 2022-10-14 DIAGNOSIS — H524 Presbyopia: Secondary | ICD-10-CM | POA: Diagnosis not present

## 2022-10-14 DIAGNOSIS — M25561 Pain in right knee: Secondary | ICD-10-CM | POA: Diagnosis not present

## 2022-10-16 ENCOUNTER — Encounter: Payer: Medicare HMO | Admitting: Neurology

## 2022-10-16 ENCOUNTER — Other Ambulatory Visit: Payer: Self-pay

## 2022-10-16 DIAGNOSIS — R202 Paresthesia of skin: Secondary | ICD-10-CM

## 2022-10-22 DIAGNOSIS — S76191A Other specified injury of right quadriceps muscle, fascia and tendon, initial encounter: Secondary | ICD-10-CM | POA: Diagnosis not present

## 2022-10-22 DIAGNOSIS — M25651 Stiffness of right hip, not elsewhere classified: Secondary | ICD-10-CM | POA: Diagnosis not present

## 2022-10-22 DIAGNOSIS — S76111A Strain of right quadriceps muscle, fascia and tendon, initial encounter: Secondary | ICD-10-CM | POA: Diagnosis not present

## 2022-10-22 DIAGNOSIS — M25551 Pain in right hip: Secondary | ICD-10-CM | POA: Diagnosis not present

## 2022-10-31 DIAGNOSIS — M75111 Incomplete rotator cuff tear or rupture of right shoulder, not specified as traumatic: Secondary | ICD-10-CM | POA: Diagnosis not present

## 2022-10-31 DIAGNOSIS — M79621 Pain in right upper arm: Secondary | ICD-10-CM | POA: Diagnosis not present

## 2022-11-05 DIAGNOSIS — M75111 Incomplete rotator cuff tear or rupture of right shoulder, not specified as traumatic: Secondary | ICD-10-CM | POA: Diagnosis not present

## 2022-11-05 DIAGNOSIS — M79621 Pain in right upper arm: Secondary | ICD-10-CM | POA: Diagnosis not present

## 2022-11-06 ENCOUNTER — Ambulatory Visit: Payer: Medicare HMO | Admitting: Neurology

## 2022-11-06 DIAGNOSIS — R202 Paresthesia of skin: Secondary | ICD-10-CM | POA: Diagnosis not present

## 2022-11-06 DIAGNOSIS — G540 Brachial plexus disorders: Secondary | ICD-10-CM

## 2022-11-06 NOTE — Procedures (Signed)
[facility]  [address] [place], [address]  [place], [place] [address] Tel: [phone] Fax: [phone] Test Date:  11/06/2022  Patient: Joel [name] DOB: 17-Sep-1945 Physician: Nita Sickle, DO  Sex: Male Height: 5\' 5"  Ref Phys: Coletta Memos, MD  ID#: [id number]   Technician:    History: This is a 77 year old man referred for evaluation of proximal right arm weakness concerning for brachial plexopathy.  NCV & EMG Findings: Extensive electrodiagnostic testing of the right upper extremity shows:  Right median, ulnar, radial, and medial antebrachial cutaneous sensory responses are within normal limits.  Right lateral antebrachial cutaneous sensory response is absent. Right median and ulnar motor responses are within normal limits.  Of note, there is evidence of a right Martin-Gruber anastomosis, a normal anatomic variant. Chronic motor axon loss changes are seen affecting the right biceps, brachial radialis, triceps, deltoid, infraspinatus, and supraspinatus muscles.  There is no evidence of accompanying active denervation.  Impression: The electrophysiologic findings are consistent with a right brachial plexopathy predominantly affecting the upper trunk, which is at least moderate in degree electrically.   ___________________________ Nita Sickle, DO    Nerve Conduction Studies   Stim Site NR Peak (ms) Norm Peak (ms) O-P Amp (V) Norm O-P Amp  Right Lat Ante Brach Cutan Anti Sensory (Lat Forearm)  32 C  Lat Biceps *NR  <2.8  >10  Right Med Ante Brach Cutan Anti Sensory (Med Forearm)  32 C  Elbow    2.0  12.2   Right Median Anti Sensory (2nd Digit)  32 C  Wrist    3.2 <3.8 12.2 >10  Right Radial Anti Sensory (Base 1st Digit)  32 C  Wrist    2.3 <2.8 16.3 >10  Right Ulnar Anti Sensory (5th Digit)  32 C  Wrist    2.7 <3.2 15.3 >5     Stim Site NR Onset (ms) Norm Onset (ms) O-P Amp (mV) Norm O-P Amp Site1 Site2 Delta-0 (ms) Dist (cm) Vel (m/s) Norm Vel (m/s)   Right Median Motor (Abd Poll Brev)  32 C  Wrist    3.3 <4.0 5.6 >5 Elbow Wrist 4.4 29.0 66 >50  Elbow    7.7  4.5  Ulnar-wrist crossover Elbow 3.7 0.0    Ulnar-wrist crossover    4.0  1.2         Right Ulnar Motor (Abd Dig Minimi)  32 C  Wrist    2.2 <3.1 10.0 >7 B Elbow Wrist 4.0 23.0 58 >50  B Elbow    6.2  8.5  A Elbow B Elbow 1.3 8.0 62 >50  A Elbow    7.5  7.6          Electromyography   Side Muscle Ins.Act Fibs Fasc Recrt Amp Dur Poly Activation Comment  Right 1stDorInt Nml Nml Nml Nml Nml Nml Nml Nml N/A  Right Abd Poll Brev Nml Nml Nml Nml Nml Nml Nml Nml N/A  Right Ext Indicis Nml Nml Nml Nml Nml Nml Nml Nml N/A  Right PronatorTeres Nml Nml Nml Nml Nml Nml Nml Nml N/A  Right Biceps Nml Nml Nml *3- *2+ *1+ *1+ Nml N/A  Right Triceps Nml Nml Nml *2- *1+ *1+ *1+ Nml N/A  Right Deltoid Nml Nml Nml *SMU *1+ *1+ *1+ Nml N/A  Right Infraspinatus Nml Nml Nml *3- *1+ *1+ *1+ Nml N/A  Right Cervical Parasp Low Nml Nml Nml Nml Nml Nml Nml Nml N/A  Right BrachioRad Nml Nml Nml *SMU *2+ *  1+ *1+ Nml N/A  Right Supraspinatus Nml Nml Nml *2- *1+ *1+ *1+ Nml N/A      Waveforms:

## 2022-11-07 ENCOUNTER — Other Ambulatory Visit: Payer: Self-pay | Admitting: Family Medicine

## 2022-11-07 DIAGNOSIS — E785 Hyperlipidemia, unspecified: Secondary | ICD-10-CM

## 2022-12-05 ENCOUNTER — Other Ambulatory Visit: Payer: Self-pay | Admitting: Family Medicine

## 2022-12-05 DIAGNOSIS — I10 Essential (primary) hypertension: Secondary | ICD-10-CM

## 2022-12-18 ENCOUNTER — Other Ambulatory Visit: Payer: Self-pay

## 2022-12-18 ENCOUNTER — Inpatient Hospital Stay
Admission: RE | Admit: 2022-12-18 | Discharge: 2022-12-18 | Disposition: A | Discharge status: Home / Self Care | Payer: Self-pay | Source: Ambulatory Visit | Attending: Neurosurgery | Admitting: Neurosurgery

## 2022-12-18 DIAGNOSIS — Z049 Encounter for examination and observation for unspecified reason: Secondary | ICD-10-CM

## 2022-12-19 DIAGNOSIS — M75121 Complete rotator cuff tear or rupture of right shoulder, not specified as traumatic: Secondary | ICD-10-CM | POA: Diagnosis not present

## 2022-12-19 DIAGNOSIS — M4802 Spinal stenosis, cervical region: Secondary | ICD-10-CM | POA: Diagnosis not present

## 2022-12-19 NOTE — H&P (View-Only) (Signed)
Referring Physician:  No referring provider defined for this encounter.  Primary Physician:  Karie Georges, MD  History of Present Illness: 12/22/2022 [name] is here today with a chief complaint of right upper extremity weakness.  He has previously been diagnosed with a right upper extremity brachial plexopathy.  This appears to be a pain-free variant.  Developed severe right upper extremity weakness mostly in an upper trunk pattern.  Has had this for approximately 1 year with no functional improvement in his shoulder flexion.  Also has weakness in shoulder abduction.  Has some biceps weakness in external rotation weakness that he has noticed as well.  Has had physical and Occupational Therapy.  Has had previous workup.  Was evaluated by an EMG repeat on 11/06/2022 with no evidence of active denervation and some evidence of continued reinnervation.  Review of Systems:  A 10 point review of systems is negative, except for the pertinent positives and negatives detailed in the HPI.  Past Medical History: Past Medical History:  Diagnosis Date   BENIGN PROSTATIC HYPERTROPHY 05/07/2009   COLONIC POLYPS, HX OF 05/07/2009   HYPERLIPIDEMIA 05/07/2009   HYPERTENSION 05/07/2009    Past Surgical History: Past Surgical History:  Procedure Laterality Date   HAND TENDON SURGERY     Left thumb   NASAL SINUS SURGERY     NM RENAL LASIX ([facility] HX)      Allergies: Allergies as of 12/22/2022   (No Known Allergies)    Medications:  Current Outpatient Medications:    amLODipine (NORVASC) 10 MG tablet, Take 1 tablet (10 mg total) by mouth daily., Disp: 90 tablet, Rfl: 2   aspirin 81 MG tablet, Take 81 mg by mouth daily., Disp: , Rfl:    CALCIUM PO, Take by mouth., Disp: , Rfl:    cholecalciferol (VITAMIN D3) 25 MCG (1000 UNIT) tablet, Take 1,000 Units by mouth daily., Disp: , Rfl:    CINNAMON PO, Take by mouth., Disp: , Rfl:    co-enzyme Q-10 30 MG capsule, Take 30 mg by mouth 3  (three) times daily., Disp: , Rfl:    finasteride (PROSCAR) 5 MG tablet, Take 5 mg by mouth daily., Disp: , Rfl:    lisinopril (ZESTRIL) 10 MG tablet, TAKE 1 TABLET BY MOUTH EVERY DAY, Disp: 90 tablet, Rfl: 1   Multiple Vitamin (MULTIVITAMIN) capsule, Take 1 capsule by mouth daily., Disp: , Rfl:    Omega 3-6-9 Fatty Acids (OMEGA-3-6-9 PO), Take by mouth., Disp: , Rfl:    simvastatin (ZOCOR) 20 MG tablet, TAKE 1 TABLET BY MOUTH EVERYDAY AT BEDTIME, Disp: 90 tablet, Rfl: 0   tamsulosin (FLOMAX) 0.4 MG CAPS capsule, Take 2 capsules by mouth daily., Disp: , Rfl:   Social History: Social History   Tobacco Use   Smoking status: Never   Smokeless tobacco: Never  Vaping Use   Vaping status: Never Used  Substance Use Topics   Alcohol use: Yes   Drug use: No    Family Medical History: Family History  Problem Relation Age of Onset   Hyperlipidemia Mother    Hypertension Mother    Stroke Mother    Aortic dissection Mother 63   Hyperlipidemia Father    Hypertension Father    Stroke Father 6       cerebellar herniation/hemorrhage    Physical Examination: Vitals:   12/22/22 1307  BP: (!) 140/78    General: Patient is in no apparent distress. Attention to examination is appropriate.  Neck:  Supple.  Full range of motion.  Respiratory: Patient is breathing without any difficulty.   NEUROLOGICAL:     Awake, alert, oriented to person, place, and time.  Speech is clear and fluent.   Cranial Nerves: Pupils equal round and reactive to light.  Facial tone is symmetric. Shoulder shrug is symmetric. Tongue protrusion is midline.  There is no pronator drift.  Motor Exam:  On physical examination, we can notice some activation of his lateral head of his deltoid, minimal activation of the posterior head and no activation of the anterior head, this does improve with the limitation of gravity.  Proximately 1-2 out of 5..  He does have strong triceps at least 4+ to 5 out of 5.  His elbow  flexion shows full active range of motion but with limited resistance to flexion at least a 4 out of 5.  He has good wrist extension hand grip.  His external rotation easily clears neutral but again is weak to confrontation.  He does notice some decrease sensation especially in the upper trunk distribution.  Medical Decision Making  Imaging: I was able to evaluate his cervical MRI as well as his brachial plexus MRI.  There are some evidence of diffuse spondylosis, however this mostly starts at the C6-7 level and below.  His MRI of his brachial plexus does not show any severe compression or masslike structures on the brachial plexus.  There does not appear to be significant T2 signal change within the brachial plexus.  Electrodiagnostics:  [facility]  [address] [place], [address]  [place], [place] [address] Tel: [phone] Fax: [phone] Test Date:  11/06/2022   Patient: Joel [name] DOB: 12-Mar-1945 Physician: Nita Sickle, DO  Sex: Male Height: 5\' 5"  Ref Phys: Coletta Memos, MD  ID#: [id number]     Technician:      History: This is a 77 year old man referred for evaluation of proximal right arm weakness concerning for brachial plexopathy.   NCV & EMG Findings: Extensive electrodiagnostic testing of the right upper extremity shows:  Right median, ulnar, radial, and medial antebrachial cutaneous sensory responses are within normal limits.  Right lateral antebrachial cutaneous sensory response is absent. Right median and ulnar motor responses are within normal limits.  Of note, there is evidence of a right Martin-Gruber anastomosis, a normal anatomic variant. Chronic motor axon loss changes are seen affecting the right biceps, brachial radialis, triceps, deltoid, infraspinatus, and supraspinatus muscles.  There is no evidence of accompanying active denervation.   Impression: The electrophysiologic findings are consistent with a right brachial plexopathy predominantly  affecting the upper trunk, which is at least moderate in degree electrically.     ___________________________ Nita Sickle, DO     Nerve Conduction Studies    Stim Site NR Peak (ms) Norm Peak (ms) O-P Amp (V) Norm O-P Amp  Right Lat Ante Brach Cutan Anti Sensory (Lat Forearm)  32 C  Lat Biceps *NR   <2.8   >10  Right Med Ante Brach Cutan Anti Sensory (Med Forearm)  32 C  Elbow    2.0   12.2    Right Median Anti Sensory (2nd Digit)  32 C  Wrist    3.2 <3.8 12.2 >10  Right Radial Anti Sensory (Base 1st Digit)  32 C  Wrist    2.3 <2.8 16.3 >10  Right Ulnar Anti Sensory (5th Digit)  32 C  Wrist    2.7 <3.2 15.3 >5       Stim Site  NR Onset (ms) Norm Onset (ms) O-P Amp (mV) Norm O-P Amp Site1 Site2 Delta-0 (ms) Dist (cm) Vel (m/s) Norm Vel (m/s)  Right Median Motor (Abd Poll Brev)  32 C  Wrist    3.3 <4.0 5.6 >5 Elbow Wrist 4.4 29.0 66 >50  Elbow    7.7   4.5   Ulnar-wrist crossover Elbow 3.7 0.0      Ulnar-wrist crossover    4.0   1.2                Right Ulnar Motor (Abd Dig Minimi)  32 C  Wrist    2.2 <3.1 10.0 >7 B Elbow Wrist 4.0 23.0 58 >50  B Elbow    6.2   8.5   A Elbow B Elbow 1.3 8.0 62 >50  A Elbow    7.5   7.6                  Electromyography    Side Muscle Ins.Act Fibs Fasc Recrt Amp Dur Poly Activation Comment  Right 1stDorInt Nml Nml Nml Nml Nml Nml Nml Nml N/A  Right Abd Poll Brev Nml Nml Nml Nml Nml Nml Nml Nml N/A  Right Ext Indicis Nml Nml Nml Nml Nml Nml Nml Nml N/A  Right PronatorTeres Nml Nml Nml Nml Nml Nml Nml Nml N/A  Right Biceps Nml Nml Nml *3- *2+ *1+ *1+ Nml N/A  Right Triceps Nml Nml Nml *2- *1+ *1+ *1+ Nml N/A  Right Deltoid Nml Nml Nml *SMU *1+ *1+ *1+ Nml N/A  Right Infraspinatus Nml Nml Nml *3- *1+ *1+ *1+ Nml N/A  Right Cervical Parasp Low Nml Nml Nml Nml Nml Nml Nml Nml N/A  Right BrachioRad Nml Nml Nml *SMU *2+ *1+ *1+ Nml N/A  Right Supraspinatus Nml Nml Nml *2- *1+ *1+ *1+ Nml N/A    I have personally reviewed the images and  electrodiagnostics and agree with the above interpretation.  Assessment and Plan: [name] is a pleasant 77 y.o. male with a right sided brachial plexopathy mostly in the upper trunk pattern who presents for 1 year of persistent right upper extremity weakness.  He states that over the past year he has not noticed a significant improvement in his shoulder function.  On physical examination he shows a 1-2 out of 5 in the anterior head activation.  Lateral head is approximately 2 out of 5.  Elbow extension is 4+ out of 5.  External rotation is 4- out of 5.  Elbow flexion is 4 to 4- out of 5.  His EMG demonstrates no active denervation but does show some signs of reinnervation with polyphasic units in the biceps triceps deltoid infraspinatus supraspinatus and brachial radialis.  This is consistent with an upper trunk brachial plexopathy with some spill into the triceps musculature.  Given the fact that he is a year out without functional deltoid strength but palpable activation noted in the lateral head we do feel that he would benefit from a radial and axillary nerve transfer with an end to side coaptation given the function noted in the lateral head.  We discussed whether or not end to side transfer would be useful for his elbow flexion, however given his functional status and ability to get his hand to his face with elbow flexion we felt like the risk may outweigh the benefit.  He would like to go forward with radial to axillary nerve transfer.  Will plan on scheduling him for this procedure.  We discussed the risk and benefits.  He would like to go forward.  Thank you for involving me in the care of this patient.    Lovenia Kim MD/MSCR Neurosurgery - Peripheral Nerve Surgery

## 2022-12-19 NOTE — Progress Notes (Signed)
Referring Physician:  No referring provider defined for this encounter.  Primary Physician:  Joel Georges, MD  History of Present Illness: 12/22/2022 Joel [name] is here today with a chief complaint of right upper extremity weakness.  He has previously been diagnosed with a right upper extremity brachial plexopathy.  This appears to be a pain-free variant.  Developed severe right upper extremity weakness mostly in an upper trunk pattern.  Has had this for approximately 1 year with no functional improvement in his shoulder flexion.  Also has weakness in shoulder abduction.  Has some biceps weakness in external rotation weakness that he has noticed as well.  Has had physical and Occupational Therapy.  Has had previous workup.  Was evaluated by an EMG repeat on 11/06/2022 with no evidence of active denervation and some evidence of continued reinnervation.  Review of Systems:  A 10 point review of systems is negative, except for the pertinent positives and negatives detailed in the HPI.  Past Medical History: Past Medical History:  Diagnosis Date   BENIGN PROSTATIC HYPERTROPHY 05/07/2009   COLONIC POLYPS, HX OF 05/07/2009   HYPERLIPIDEMIA 05/07/2009   HYPERTENSION 05/07/2009    Past Surgical History: Past Surgical History:  Procedure Laterality Date   HAND TENDON SURGERY     Left thumb   NASAL SINUS SURGERY     NM RENAL LASIX ([facility] HX)      Allergies: Allergies as of 12/22/2022   (No Known Allergies)    Medications:  Current Outpatient Medications:    amLODipine (NORVASC) 10 MG tablet, Take 1 tablet (10 mg total) by mouth daily., Disp: 90 tablet, Rfl: 2   aspirin 81 MG tablet, Take 81 mg by mouth daily., Disp: , Rfl:    CALCIUM PO, Take by mouth., Disp: , Rfl:    cholecalciferol (VITAMIN D3) 25 MCG (1000 UNIT) tablet, Take 1,000 Units by mouth daily., Disp: , Rfl:    CINNAMON PO, Take by mouth., Disp: , Rfl:    co-enzyme Q-10 30 MG capsule, Take 30 mg by mouth 3  (three) times daily., Disp: , Rfl:    finasteride (PROSCAR) 5 MG tablet, Take 5 mg by mouth daily., Disp: , Rfl:    lisinopril (ZESTRIL) 10 MG tablet, TAKE 1 TABLET BY MOUTH EVERY DAY, Disp: 90 tablet, Rfl: 1   Multiple Vitamin (MULTIVITAMIN) capsule, Take 1 capsule by mouth daily., Disp: , Rfl:    Omega 3-6-9 Fatty Acids (OMEGA-3-6-9 PO), Take by mouth., Disp: , Rfl:    simvastatin (ZOCOR) 20 MG tablet, TAKE 1 TABLET BY MOUTH EVERYDAY AT BEDTIME, Disp: 90 tablet, Rfl: 0   tamsulosin (FLOMAX) 0.4 MG CAPS capsule, Take 2 capsules by mouth daily., Disp: , Rfl:   Social History: Social History   Tobacco Use   Smoking status: Never   Smokeless tobacco: Never  Vaping Use   Vaping status: Never Used  Substance Use Topics   Alcohol use: Yes   Drug use: No    Family Medical History: Family History  Problem Relation Age of Onset   Hyperlipidemia Mother    Hypertension Mother    Stroke Mother    Aortic dissection Mother 63   Hyperlipidemia Father    Hypertension Father    Stroke Father 6       cerebellar herniation/hemorrhage    Physical Examination: Vitals:   12/22/22 1307  BP: (!) 140/78    General: Patient is in no apparent distress. Attention to examination is appropriate.  Neck:  Supple.  Full range of motion.  Respiratory: Patient is breathing without any difficulty.   NEUROLOGICAL:     Awake, alert, oriented to person, place, and time.  Speech is clear and fluent.   Cranial Nerves: Pupils equal round and reactive to light.  Facial tone is symmetric. Shoulder shrug is symmetric. Tongue protrusion is midline.  There is no pronator drift.  Motor Exam:  On physical examination, we can notice some activation of his lateral head of his deltoid, minimal activation of the posterior head and no activation of the anterior head, this does improve with the limitation of gravity.  Proximately 1-2 out of 5..  He does have strong triceps at least 4+ to 5 out of 5.  His elbow  flexion shows full active range of motion but with limited resistance to flexion at least a 4 out of 5.  He has good wrist extension hand grip.  His external rotation easily clears neutral but again is weak to confrontation.  He does notice some decrease sensation especially in the upper trunk distribution.  Medical Decision Making  Imaging: I was able to evaluate his cervical MRI as well as his brachial plexus MRI.  There are some evidence of diffuse spondylosis, however this mostly starts at the C6-7 level and below.  His MRI of his brachial plexus does not show any severe compression or masslike structures on the brachial plexus.  There does not appear to be significant T2 signal change within the brachial plexus.  Electrodiagnostics:  [facility]  [address], [address]  [place], [place] [address] Tel: [phone] Fax: [phone] Test Date:  11/06/2022   Patient: Joel [name] DOB: 12-Mar-1945 Physician: Nita Sickle, DO  Sex: Male Height: 5\' 5"  Ref Phys: Coletta Memos, MD  ID#: [id number]     Technician:      History: This is a 77 year old man referred for evaluation of proximal right arm weakness concerning for brachial plexopathy.   NCV & EMG Findings: Extensive electrodiagnostic testing of the right upper extremity shows:  Right median, ulnar, radial, and medial antebrachial cutaneous sensory responses are within normal limits.  Right lateral antebrachial cutaneous sensory response is absent. Right median and ulnar motor responses are within normal limits.  Of note, there is evidence of a right Martin-Gruber anastomosis, a normal anatomic variant. Chronic motor axon loss changes are seen affecting the right biceps, brachial radialis, triceps, deltoid, infraspinatus, and supraspinatus muscles.  There is no evidence of accompanying active denervation.   Impression: The electrophysiologic findings are consistent with a right brachial plexopathy predominantly  affecting the upper trunk, which is at least moderate in degree electrically.     ___________________________ Nita Sickle, DO     Nerve Conduction Studies    Stim Site NR Peak (ms) Norm Peak (ms) O-P Amp (V) Norm O-P Amp  Right Lat Ante Brach Cutan Anti Sensory (Lat Forearm)  32 C  Lat Biceps *NR   <2.8   >10  Right Med Ante Brach Cutan Anti Sensory (Med Forearm)  32 C  Elbow    2.0   12.2    Right Median Anti Sensory (2nd Digit)  32 C  Wrist    3.2 <3.8 12.2 >10  Right Radial Anti Sensory (Base 1st Digit)  32 C  Wrist    2.3 <2.8 16.3 >10  Right Ulnar Anti Sensory (5th Digit)  32 C  Wrist    2.7 <3.2 15.3 >5       Stim Site  NR Onset (ms) Norm Onset (ms) O-P Amp (mV) Norm O-P Amp Site1 Site2 Delta-0 (ms) Dist (cm) Vel (m/s) Norm Vel (m/s)  Right Median Motor (Abd Poll Brev)  32 C  Wrist    3.3 <4.0 5.6 >5 Elbow Wrist 4.4 29.0 66 >50  Elbow    7.7   4.5   Ulnar-wrist crossover Elbow 3.7 0.0      Ulnar-wrist crossover    4.0   1.2                Right Ulnar Motor (Abd Dig Minimi)  32 C  Wrist    2.2 <3.1 10.0 >7 B Elbow Wrist 4.0 23.0 58 >50  B Elbow    6.2   8.5   A Elbow B Elbow 1.3 8.0 62 >50  A Elbow    7.5   7.6                  Electromyography    Side Muscle Ins.Act Fibs Fasc Recrt Amp Dur Poly Activation Comment  Right 1stDorInt Nml Nml Nml Nml Nml Nml Nml Nml N/A  Right Abd Poll Brev Nml Nml Nml Nml Nml Nml Nml Nml N/A  Right Ext Indicis Nml Nml Nml Nml Nml Nml Nml Nml N/A  Right PronatorTeres Nml Nml Nml Nml Nml Nml Nml Nml N/A  Right Biceps Nml Nml Nml *3- *2+ *1+ *1+ Nml N/A  Right Triceps Nml Nml Nml *2- *1+ *1+ *1+ Nml N/A  Right Deltoid Nml Nml Nml *SMU *1+ *1+ *1+ Nml N/A  Right Infraspinatus Nml Nml Nml *3- *1+ *1+ *1+ Nml N/A  Right Cervical Parasp Low Nml Nml Nml Nml Nml Nml Nml Nml N/A  Right BrachioRad Nml Nml Nml *SMU *2+ *1+ *1+ Nml N/A  Right Supraspinatus Nml Nml Nml *2- *1+ *1+ *1+ Nml N/A    I have personally reviewed the images and  electrodiagnostics and agree with the above interpretation.  Assessment and Plan: Mr. Mangino is a pleasant 77 y.o. male with a right sided brachial plexopathy mostly in the upper trunk pattern who presents for 1 year of persistent right upper extremity weakness.  He states that over the past year he has not noticed a significant improvement in his shoulder function.  On physical examination he shows a 1-2 out of 5 in the anterior head activation.  Lateral head is approximately 2 out of 5.  Elbow extension is 4+ out of 5.  External rotation is 4- out of 5.  Elbow flexion is 4 to 4- out of 5.  His EMG demonstrates no active denervation but does show some signs of reinnervation with polyphasic units in the biceps triceps deltoid infraspinatus supraspinatus and brachial radialis.  This is consistent with an upper trunk brachial plexopathy with some spill into the triceps musculature.  Given the fact that he is a year out without functional deltoid strength but palpable activation noted in the lateral head we do feel that he would benefit from a radial and axillary nerve transfer with an end to side coaptation given the function noted in the lateral head.  We discussed whether or not end to side transfer would be useful for his elbow flexion, however given his functional status and ability to get his hand to his face with elbow flexion we felt like the risk may outweigh the benefit.  He would like to go forward with radial to axillary nerve transfer.  Will plan on scheduling him for this procedure.  We discussed the risk and benefits.  He would like to go forward.  Thank you for involving me in the care of this patient.    Lovenia Kim MD/MSCR Neurosurgery - Peripheral Nerve Surgery

## 2022-12-22 ENCOUNTER — Encounter: Payer: Self-pay | Admitting: Neurosurgery

## 2022-12-22 ENCOUNTER — Ambulatory Visit: Payer: Medicare HMO | Admitting: Neurosurgery

## 2022-12-22 VITALS — BP 140/78 | Ht 65.0 in | Wt 186.8 lb

## 2022-12-22 DIAGNOSIS — G540 Brachial plexus disorders: Secondary | ICD-10-CM

## 2022-12-22 DIAGNOSIS — R29898 Other symptoms and signs involving the musculoskeletal system: Secondary | ICD-10-CM | POA: Insufficient documentation

## 2022-12-22 NOTE — Patient Instructions (Addendum)
Please see below for information in regards to your upcoming surgery:   Planned surgery: right sided radial axillary nerve transfer    Surgery date: 01/06/23 at [facility] (Medical Mall: [address] [place], [place] [address]) - you will find out your arrival time the business day before your surgery.   Pre-op appointment at [facility] Pre-admit Testing: we will call you with a date/time for this. If you are scheduled for an in person appointment, Pre-admit Testing is located on the first floor of the [facility] building, [address], [address]. Please bring all prescriptions in the original prescription bottles to your appointment. During this appointment, they will advise you which medications you can take the morning of surgery, and which medications you will need to hold for surgery. Labs (such as blood work, EKG) may be done at your pre-op appointment. You are not required to fast for these labs. Should you need to change your pre-op appointment, please call Pre-admit testing at [phone].     Blood thinners:   Aspirin:  stop aspirin 7 days prior, can resume 7 days after       Surgical clearance: we will send a clearance form to Dr Casimiro Needle. They may wish to see you in their office prior to signing the clearance form. If so, they may call you to schedule an appointment.    Restrictions after surgery: 10 pound lifting restriction for 2 weeks after surgery   How to contact us:  If you have any questions/concerns before or after surgery, you can reach [place] at [phone], or you can send a mychart message. We can be reached by phone or mychart 8am-4pm, Monday-Friday.  *Please note: Calls after 4pm are forwarded to a third party answering service. Mychart messages are not routinely monitored during evenings, weekends, and holidays. Please call our office to contact the answering service for urgent concerns during non-business  hours.    Appointments/FMLA & disability paperwork: Joycelyn Rua, & Flonnie Hailstone Registered Nurse/Surgery scheduler: Royston Cowper Medical Assistants: Nash Mantis Physician Assistants: Manning Charity, PA-C & Drake Leach, PA-C Surgeons: Venetia Night, MD & Ernestine Mcmurray, MD

## 2022-12-23 ENCOUNTER — Other Ambulatory Visit: Payer: Self-pay

## 2022-12-23 DIAGNOSIS — Z01818 Encounter for other preprocedural examination: Secondary | ICD-10-CM

## 2022-12-24 ENCOUNTER — Other Ambulatory Visit: Payer: Self-pay | Admitting: Family Medicine

## 2022-12-30 ENCOUNTER — Encounter: Payer: Self-pay | Admitting: Family Medicine

## 2022-12-30 ENCOUNTER — Other Ambulatory Visit: Payer: Self-pay

## 2022-12-30 ENCOUNTER — Encounter
Admission: RE | Admit: 2022-12-30 | Discharge: 2022-12-30 | Disposition: A | Discharge status: Home / Self Care | Payer: Medicare HMO | Source: Ambulatory Visit | Attending: Neurosurgery | Admitting: Neurosurgery

## 2022-12-30 VITALS — HR 65 | Ht 65.0 in | Wt 187.2 lb

## 2022-12-30 DIAGNOSIS — I10 Essential (primary) hypertension: Secondary | ICD-10-CM | POA: Diagnosis not present

## 2022-12-30 DIAGNOSIS — Z01818 Encounter for other preprocedural examination: Secondary | ICD-10-CM | POA: Insufficient documentation

## 2022-12-30 DIAGNOSIS — R9431 Abnormal electrocardiogram [ECG] [EKG]: Secondary | ICD-10-CM | POA: Insufficient documentation

## 2022-12-30 DIAGNOSIS — Z01812 Encounter for preprocedural laboratory examination: Secondary | ICD-10-CM

## 2022-12-30 HISTORY — DX: Brachial plexus disorders: G54.0

## 2022-12-30 HISTORY — DX: Unspecified osteoarthritis, unspecified site: M19.90

## 2022-12-30 LAB — BASIC METABOLIC PANEL
Anion gap: 9 (ref 5–15)
BUN: 26 mg/dL — ABNORMAL HIGH (ref 8–23)
CO2: 26 mmol/L (ref 22–32)
Calcium: 9.4 mg/dL (ref 8.9–10.3)
Chloride: 104 mmol/L (ref 98–111)
Creatinine, Ser: 0.92 mg/dL (ref 0.61–1.24)
GFR, Estimated: >60 mL/min (ref >60)
Glucose, Bld: 117 mg/dL — ABNORMAL HIGH (ref 70–99)
Potassium: 3.7 mmol/L (ref 3.5–5.1)
Sodium: 139 mmol/L (ref 135–145)

## 2022-12-30 LAB — CBC
HCT: 44.3 % (ref 39.0–52.0)
Hemoglobin: 14.8 g/dL (ref 13.0–17.0)
MCH: 30 pg (ref 26.0–34.0)
MCHC: 33.4 g/dL (ref 30.0–36.0)
MCV: 89.9 fL (ref 80.0–100.0)
Platelets: 271 10*3/uL (ref 150–400)
RBC: 4.93 MIL/uL (ref 4.22–5.81)
RDW: 13.3 % (ref 11.5–15.5)
WBC: 6.9 10*3/uL (ref 4.0–10.5)
nRBC: 0 % (ref 0.0–0.2)

## 2022-12-30 NOTE — Patient Instructions (Addendum)
Your procedure is scheduled on: 01/06/23 - Tuesday Report to the Registration Desk on the 1st floor of the Medical Mall. To find out your arrival time, please call [phone] between 1PM - 3PM on: 01/05/23 - Monday If your arrival time is 6:00 am, do not arrive before that time as the Medical Mall entrance doors do not open until 6:00 am.  REMEMBER: Instructions that are not followed completely may result in serious medical risk, up to and including death; or upon the discretion of your surgeon and anesthesiologist your surgery may need to be rescheduled.  Do not eat food after midnight the night before surgery.  No gum chewing or hard candies.  You may however, drink CLEAR liquids up to 2 hours before you are scheduled to arrive for your surgery. Do not drink anything within 2 hours of your scheduled arrival time.  Clear liquids include: - water  - apple juice without pulp - gatorade (not RED colors) - black coffee or tea (Do NOT add milk or creamers to the coffee or tea) Do NOT drink anything that is not on this list.  One week prior to surgery starting 12/30/22:   Stop Anti-inflammatories (NSAIDS) such as Advil, Aleve, Ibuprofen, Motrin, Naproxen, Naprosyn and Aspirin based products such as Excedrin, Goody's Powder, BC Powder. You may however, continue to take Tylenol if needed for pain up until the day of surgery.  Stop ANY OVER THE COUNTER supplements until after surgery.  HOLD Aspirin beginning 12/30/22, may resume 7 days after surgery.  HOLD lisinopril (ZESTRIL) on the day of surgery   ON THE DAY OF SURGERY ONLY TAKE THESE MEDICATIONS WITH SIPS OF WATER:  amLODipine (NORVASC)  tamsulosin (FLOMAX)  finasteride (PROSCAR)    No Alcohol for 24 hours before or after surgery.  No Smoking including e-cigarettes for 24 hours before surgery.  No chewable tobacco products for at least 6 hours before surgery.  No nicotine patches on the day of surgery.  Do not use any  "recreational" drugs for at least a week (preferably 2 weeks) before your surgery.  Please be advised that the combination of cocaine and anesthesia may have negative outcomes, up to and including death. If you test positive for cocaine, your surgery will be cancelled.  On the morning of surgery brush your teeth with toothpaste and water, you may rinse your mouth with mouthwash if you wish. Do not swallow any toothpaste or mouthwash.  Use CHG Soap or wipes as directed on instruction sheet.  Do not wear jewelry, make-up, hairpins, clips or nail polish.  For welded (permanent) jewelry: bracelets, anklets, waist bands, etc.  Please have this removed prior to surgery.  If it is not removed, there is a chance that hospital personnel will need to cut it off on the day of surgery.  Do not wear lotions, powders, or perfumes.   Do not shave body hair from the neck down 48 hours before surgery.  Contact lenses, hearing aids and dentures may not be worn into surgery.  Do not bring valuables to the hospital. [facility] is not responsible for any missing/lost belongings or valuables.   Notify your doctor if there is any change in your medical condition (cold, fever, infection).  Wear comfortable clothing (specific to your surgery type) to the hospital.  After surgery, you can help prevent lung complications by doing breathing exercises.  Take deep breaths and cough every 1-2 hours. Your doctor may order a device called an Incentive Spirometer to help  you take deep breaths. When coughing or sneezing, hold a pillow firmly against your incision with both hands. This is called "splinting." Doing this helps protect your incision. It also decreases belly discomfort.  If you are being admitted to the hospital overnight, leave your suitcase in the car. After surgery it may be brought to your room.  In case of increased patient census, it may be necessary for you, the patient, to continue your  postoperative care in the Same Day Surgery department.  If you are being discharged the day of surgery, you will not be allowed to drive home. You will need a responsible individual to drive you home and stay with you for 24 hours after surgery.   If you are taking public transportation, you will need to have a responsible individual with you.  Please call the Pre-admissions Testing Dept. at [phone] if you have any questions about these instructions.  Surgery Visitation Policy:  Patients having surgery or a procedure may have two visitors.  Children under the age of 40 must have an adult with them who is not the patient.  Inpatient Visitation:    Visiting hours are 7 a.m. to 8 p.m. Up to four visitors are allowed at one time in a patient room. The visitors may rotate out with other people during the day.  One visitor age 11 or older may stay with the patient overnight and must be in the room by 8 p.m.     Preparing for Surgery with CHLORHEXIDINE GLUCONATE (CHG) Soap  Chlorhexidine Gluconate (CHG) Soap  o An antiseptic cleaner that kills germs and bonds with the skin to continue killing germs even after washing  o Used for showering the night before surgery and morning of surgery  Before surgery, you can play an important role by reducing the number of germs on your skin.  CHG (Chlorhexidine gluconate) soap is an antiseptic cleanser which kills germs and bonds with the skin to continue killing germs even after washing.  Please do not use if you have an allergy to CHG or antibacterial soaps. If your skin becomes reddened/irritated stop using the CHG.  1. Shower the NIGHT BEFORE SURGERY and the MORNING OF SURGERY with CHG soap.  2. If you choose to wash your hair, wash your hair first as usual with your normal shampoo.  3. After shampooing, rinse your hair and body thoroughly to remove the shampoo.  4. Use CHG as you would any other liquid soap. You can apply CHG  directly to the skin and wash gently with a scrungie or a clean washcloth.  5. Apply the CHG soap to your body only from the neck down. Do not use on open wounds or open sores. Avoid contact with your eyes, ears, mouth, and genitals (private parts). Wash face and genitals (private parts) with your normal soap.  6. Wash thoroughly, paying special attention to the area where your surgery will be performed.  7. Thoroughly rinse your body with warm water.  8. Do not shower/wash with your normal soap after using and rinsing off the CHG soap.  9. Pat yourself dry with a clean towel.  10. Wear clean pajamas to bed the night before surgery.  12. Place clean sheets on your bed the night of your first shower and do not sleep with pets.  13. Shower again with the CHG soap on the day of surgery prior to arriving at the hospital.  14. Do not apply any deodorants/lotions/powders.  15. Please  wear clean clothes to the hospital.

## 2022-12-31 MED ORDER — AMLODIPINE BESYLATE 10 MG PO TABS: 10.0000 mg | ORAL_TABLET | Freq: Every day | ORAL | 0 refills | Status: DC

## 2022-12-31 NOTE — Telephone Encounter (Signed)
See prior Mychart message.   

## 2023-01-06 ENCOUNTER — Ambulatory Visit: Payer: Medicare HMO | Admitting: Urgent Care

## 2023-01-06 ENCOUNTER — Other Ambulatory Visit: Payer: Self-pay

## 2023-01-06 ENCOUNTER — Encounter
Admission: RE | Disposition: A | Discharge status: Home / Self Care | Payer: Self-pay | Source: Home / Self Care | Attending: Neurosurgery

## 2023-01-06 ENCOUNTER — Encounter: Payer: Self-pay | Admitting: Neurosurgery

## 2023-01-06 ENCOUNTER — Ambulatory Visit
Admission: RE | Admit: 2023-01-06 | Discharge: 2023-01-06 | Disposition: A | Discharge status: Home / Self Care | Payer: Medicare HMO | Attending: Neurosurgery | Admitting: Neurosurgery

## 2023-01-06 ENCOUNTER — Ambulatory Visit: Payer: Medicare HMO | Admitting: Anesthesiology

## 2023-01-06 DIAGNOSIS — R29898 Other symptoms and signs involving the musculoskeletal system: Secondary | ICD-10-CM | POA: Diagnosis present

## 2023-01-06 DIAGNOSIS — R531 Weakness: Secondary | ICD-10-CM | POA: Insufficient documentation

## 2023-01-06 DIAGNOSIS — I10 Essential (primary) hypertension: Secondary | ICD-10-CM | POA: Insufficient documentation

## 2023-01-06 DIAGNOSIS — G540 Brachial plexus disorders: Secondary | ICD-10-CM | POA: Diagnosis not present

## 2023-01-06 DIAGNOSIS — Z01818 Encounter for other preprocedural examination: Secondary | ICD-10-CM

## 2023-01-06 SURGERY — NERVE TRANSFER
Anesthesia: General | Laterality: Right

## 2023-01-06 MED ORDER — CEFAZOLIN SODIUM-DEXTROSE 2-4 GM/100ML-% IV SOLN
INTRAVENOUS | Status: AC
  Filled 2023-01-06: qty 100

## 2023-01-06 MED ORDER — CHLORHEXIDINE GLUCONATE 0.12 % MT SOLN
15.0000 mL | Freq: Once | OROMUCOSAL | Status: AC
Start: 2023-01-06 — End: 2023-01-06
  Administered 2023-01-06: 15 mL via OROMUCOSAL

## 2023-01-06 MED ORDER — GLYCOPYRROLATE 0.2 MG/ML IJ SOLN
INTRAMUSCULAR | Status: DC | PRN
  Administered 2023-01-06: .2 mg via INTRAVENOUS

## 2023-01-06 MED ORDER — FENTANYL CITRATE (PF) 100 MCG/2ML IJ SOLN: 25.0000 ug | INTRAMUSCULAR | Status: DC | PRN

## 2023-01-06 MED ORDER — PROPOFOL 10 MG/ML IV BOLUS
INTRAVENOUS | Status: AC
  Filled 2023-01-06: qty 40

## 2023-01-06 MED ORDER — METHYLPREDNISOLONE ACETATE 40 MG/ML IJ SUSP
INTRAMUSCULAR | Status: AC
  Filled 2023-01-06: qty 1

## 2023-01-06 MED ORDER — BUPIVACAINE-EPINEPHRINE 0.5% -1:200000 IJ SOLN
INTRAMUSCULAR | Status: DC | PRN
  Administered 2023-01-06: 10 mL

## 2023-01-06 MED ORDER — PHENYLEPHRINE 80 MCG/ML (10ML) SYRINGE FOR IV PUSH (FOR BLOOD PRESSURE SUPPORT)
PREFILLED_SYRINGE | INTRAVENOUS | Status: DC | PRN
  Administered 2023-01-06: 80 ug via INTRAVENOUS
  Administered 2023-01-06: 160 ug via INTRAVENOUS
  Administered 2023-01-06: 80 ug via INTRAVENOUS
  Administered 2023-01-06: 160 ug via INTRAVENOUS

## 2023-01-06 MED ORDER — ACETAMINOPHEN 10 MG/ML IV SOLN
INTRAVENOUS | Status: AC
  Filled 2023-01-06: qty 100

## 2023-01-06 MED ORDER — LACTATED RINGERS IV SOLN: INTRAVENOUS | Status: DC

## 2023-01-06 MED ORDER — PROPOFOL 10 MG/ML IV BOLUS
INTRAVENOUS | Status: DC | PRN
  Administered 2023-01-06 (×3): 50 mg via INTRAVENOUS
  Administered 2023-01-06: 100 mg via INTRAVENOUS

## 2023-01-06 MED ORDER — OXYCODONE HCL 5 MG PO TABS: 5.0000 mg | ORAL_TABLET | ORAL | 0 refills | Status: AC | PRN

## 2023-01-06 MED ORDER — PHENYLEPHRINE 80 MCG/ML (10ML) SYRINGE FOR IV PUSH (FOR BLOOD PRESSURE SUPPORT)
PREFILLED_SYRINGE | INTRAVENOUS | Status: AC
  Filled 2023-01-06: qty 10

## 2023-01-06 MED ORDER — ONDANSETRON HCL 4 MG/2ML IJ SOLN
INTRAMUSCULAR | Status: DC | PRN
  Administered 2023-01-06: 4 mg via INTRAVENOUS

## 2023-01-06 MED ORDER — SUCCINYLCHOLINE CHLORIDE 200 MG/10ML IV SOSY
PREFILLED_SYRINGE | INTRAVENOUS | Status: DC | PRN
  Administered 2023-01-06: 100 mg via INTRAVENOUS

## 2023-01-06 MED ORDER — ACETAMINOPHEN 10 MG/ML IV SOLN: 1000.0000 mg | Freq: Once | INTRAVENOUS | Status: DC | PRN

## 2023-01-06 MED ORDER — DEXAMETHASONE SODIUM PHOSPHATE 10 MG/ML IJ SOLN
INTRAMUSCULAR | Status: AC
  Filled 2023-01-06: qty 1

## 2023-01-06 MED ORDER — ONDANSETRON HCL 4 MG/2ML IJ SOLN
INTRAMUSCULAR | Status: AC
  Filled 2023-01-06: qty 2

## 2023-01-06 MED ORDER — EPHEDRINE SULFATE-NACL 50-0.9 MG/10ML-% IV SOSY
PREFILLED_SYRINGE | INTRAVENOUS | Status: DC | PRN
  Administered 2023-01-06: 5 mg via INTRAVENOUS
  Administered 2023-01-06 (×2): 10 mg via INTRAVENOUS

## 2023-01-06 MED ORDER — EPHEDRINE 5 MG/ML INJ
INTRAVENOUS | Status: AC
  Filled 2023-01-06: qty 5

## 2023-01-06 MED ORDER — CHLORHEXIDINE GLUCONATE 0.12 % MT SOLN
OROMUCOSAL | Status: AC
  Filled 2023-01-06: qty 15

## 2023-01-06 MED ORDER — LIDOCAINE HCL (CARDIAC) PF 100 MG/5ML IV SOSY
PREFILLED_SYRINGE | INTRAVENOUS | Status: DC | PRN
  Administered 2023-01-06: 80 mg via INTRAVENOUS
  Administered 2023-01-06: 20 mg via INTRAVENOUS

## 2023-01-06 MED ORDER — ACETAMINOPHEN 10 MG/ML IV SOLN
INTRAVENOUS | Status: DC | PRN
  Administered 2023-01-06: 1000 mg via INTRAVENOUS

## 2023-01-06 MED ORDER — BUPIVACAINE-EPINEPHRINE (PF) 0.5% -1:200000 IJ SOLN
INTRAMUSCULAR | Status: AC
  Filled 2023-01-06: qty 10

## 2023-01-06 MED ORDER — LIDOCAINE HCL (PF) 2 % IJ SOLN
INTRAMUSCULAR | Status: AC
  Filled 2023-01-06: qty 5

## 2023-01-06 MED ORDER — PHENYLEPHRINE HCL-NACL 20-0.9 MG/250ML-% IV SOLN
INTRAVENOUS | Status: AC
  Filled 2023-01-06: qty 250

## 2023-01-06 MED ORDER — GLYCOPYRROLATE 0.2 MG/ML IJ SOLN
INTRAMUSCULAR | Status: AC
  Filled 2023-01-06: qty 1

## 2023-01-06 MED ORDER — ROCURONIUM BROMIDE 10 MG/ML (PF) SYRINGE
PREFILLED_SYRINGE | INTRAVENOUS | Status: AC
  Filled 2023-01-06: qty 10

## 2023-01-06 MED ORDER — FENTANYL CITRATE (PF) 100 MCG/2ML IJ SOLN
INTRAMUSCULAR | Status: AC
  Filled 2023-01-06: qty 2

## 2023-01-06 MED ORDER — 0.9 % SODIUM CHLORIDE (POUR BTL) OPTIME
TOPICAL | Status: DC | PRN
  Administered 2023-01-06: 325 mL

## 2023-01-06 MED ORDER — TISSEEL 10 ML EX KIT
PACK | CUTANEOUS | Status: DC | PRN
  Administered 2023-01-06: 10 mL via TOPICAL

## 2023-01-06 MED ORDER — OXYCODONE HCL 5 MG PO TABS: 5.0000 mg | ORAL_TABLET | Freq: Once | ORAL | Status: DC | PRN

## 2023-01-06 MED ORDER — OXYCODONE HCL 5 MG/5ML PO SOLN: 5.0000 mg | Freq: Once | ORAL | Status: DC | PRN

## 2023-01-06 MED ORDER — ORAL CARE MOUTH RINSE: 15.0000 mL | Freq: Once | OROMUCOSAL | Status: AC

## 2023-01-06 MED ORDER — FENTANYL CITRATE (PF) 100 MCG/2ML IJ SOLN
INTRAMUSCULAR | Status: DC | PRN
  Administered 2023-01-06 (×2): 50 ug via INTRAVENOUS

## 2023-01-06 MED ORDER — CEFAZOLIN IN SODIUM CHLORIDE 2-0.9 GM/100ML-% IV SOLN
2.0000 g | Freq: Once | INTRAVENOUS | Status: AC
  Administered 2023-01-06: 2 g via INTRAVENOUS

## 2023-01-06 MED ORDER — ONDANSETRON HCL 4 MG/2ML IJ SOLN: 4.0000 mg | Freq: Once | INTRAMUSCULAR | Status: DC | PRN

## 2023-01-06 MED ORDER — PHENYLEPHRINE HCL-NACL 20-0.9 MG/250ML-% IV SOLN
INTRAVENOUS | Status: DC | PRN
  Administered 2023-01-06: 40 ug/min via INTRAVENOUS

## 2023-01-06 MED ORDER — DEXAMETHASONE SODIUM PHOSPHATE 10 MG/ML IJ SOLN
INTRAMUSCULAR | Status: DC | PRN
  Administered 2023-01-06: 10 mg via INTRAVENOUS

## 2023-01-06 MED ORDER — SUCCINYLCHOLINE CHLORIDE 200 MG/10ML IV SOSY
PREFILLED_SYRINGE | INTRAVENOUS | Status: AC
  Filled 2023-01-06: qty 10

## 2023-01-06 SURGICAL SUPPLY — 37 items
ADH SKN CLS APL DERMABOND .7 (GAUZE/BANDAGES/DRESSINGS) ×1
APL PRP STRL LF DISP 70% ISPRP (MISCELLANEOUS) ×1
BNDG GAUZE DERMACEA FLUFF 4 (GAUZE/BANDAGES/DRESSINGS) ×1 IMPLANT
BNDG GZE DERMACEA 4 6PLY (GAUZE/BANDAGES/DRESSINGS) ×1
CHLORAPREP W/TINT 26 (MISCELLANEOUS) ×1 IMPLANT
DERMABOND ADVANCED .7 DNX12 (GAUZE/BANDAGES/DRESSINGS) ×1 IMPLANT
DRAPE SURG 17X11 SM STRL (DRAPES) ×2 IMPLANT
DRSG OPSITE POSTOP 4X8 (GAUZE/BANDAGES/DRESSINGS) IMPLANT
FORCEPS JEWEL BIP 4-3/4 STR (INSTRUMENTS) ×1 IMPLANT
GAUZE SPONGE 4X4 12PLY STRL (GAUZE/BANDAGES/DRESSINGS) ×1 IMPLANT
GLOVE BIOGEL PI IND STRL 8 (GLOVE) ×1 IMPLANT
GLOVE SURG SYN 7.5 E (GLOVE) ×1 IMPLANT
GLOVE SURG SYN 7.5 PF PI (GLOVE) ×1 IMPLANT
GOWN STRL REUS W/ TWL LRG LVL3 (GOWN DISPOSABLE) ×1 IMPLANT
GOWN STRL REUS W/ TWL XL LVL3 (GOWN DISPOSABLE) ×1 IMPLANT
GOWN STRL REUS W/TWL LRG LVL3 (GOWN DISPOSABLE) ×1
GOWN STRL REUS W/TWL XL LVL3 (GOWN DISPOSABLE) ×1
KIT TURNOVER KIT A (KITS) ×1 IMPLANT
LOOP VESSEL MINI 0.8X406 BLUE (MISCELLANEOUS) IMPLANT
MANIFOLD NEPTUNE II (INSTRUMENTS) ×1 IMPLANT
NDL HYPO 25X1 1.5 SAFETY (NEEDLE) ×1 IMPLANT
NEEDLE HYPO 25X1 1.5 SAFETY (NEEDLE) ×1 IMPLANT
NS IRRIG 500ML POUR BTL (IV SOLUTION) ×1 IMPLANT
PACK BASIN MINOR ARMC (MISCELLANEOUS) ×1 IMPLANT
PACK EXTREMITY ARMC (MISCELLANEOUS) ×1 IMPLANT
PROBE NEUROSIGN BIPOL (MISCELLANEOUS) IMPLANT
PROBE NEUROSIGN BIPOLAR (MISCELLANEOUS) ×1
SEALANT FIBRIN TSSL HEMOSTAT 4 (Miscellaneous) IMPLANT
SPONGE KITTNER 5P (MISCELLANEOUS) ×1 IMPLANT
SUT DVC VLOC 90 3-0 CV23 UNDY (SUTURE) IMPLANT
SUT ETHILON 8 0 (SUTURE) ×1 IMPLANT
SUT VIC AB 2-0 SH 27 (SUTURE) ×1
SUT VIC AB 2-0 SH 27XBRD (SUTURE) IMPLANT
SUT VIC AB 3-0 SH 27 (SUTURE) ×2
SUT VIC AB 3-0 SH 27X BRD (SUTURE) IMPLANT
TOWEL OR 17X26 4PK STRL BLUE (TOWEL DISPOSABLE) IMPLANT
WATER STERILE IRR 500ML POUR (IV SOLUTION) ×1 IMPLANT

## 2023-01-06 NOTE — Op Note (Signed)
Indications: [name] is suffering from right-sided brachial plexopathy with severe weakness in his right deltoid.  He has been followed for monitoring for any improvement in his right-sided deltoid.  He has not seen any improvement in the functional status.  Given his prolonged weakness and available donor site we planned for a radial to axillary nerve transfer to help with his deltoid function  Findings: Anterior head of the deltoid did not stimulate  Preoperative Diagnosis: Brachial plexopathy Right sided arm weakness  Postoperative Diagnosis: same   EBL: 30 ml IVF: see anesthesia record Drains: none Disposition: Extubated and Stable to PACU Complications: none  No foley catheter was placed.   Preoperative Note: Patient was seen in the preoperative area.  Continue to have severe right-sided weakness.  This was mostly in his anterior deltoid distribution.  He had not had any significant improvement.  Given his continued lack of functional use of his right shoulder we planned for a radial axillary nerve transfer  Risks of surgery discussed include: infection, bleeding, stroke, coma, death, paralysis, CSF leak, nerve/spinal cord injury, numbness, tingling, weakness, complex regional pain syndrome, recurrent stenosis and/or disc herniation, vascular injury, development of instability, neck/back pain, need for further surgery, persistent symptoms, development of deformity, and the risks of anesthesia. The patient understood these risks and agreed to proceed.  Operative Note:   1) radial to axillary nerve transfer, and a side, right sided 2) microsurgical technique for coaptation  The patient was then brought from the preoperative center with intravenous access established.  The patient underwent general anesthesia and endotracheal tube intubation, they were positioned on the flattop bed, the bed was turned 90 degrees.  The skin was then thoroughly cleansed.  Perioperative  antibiotic prophylaxis was administered.  Sterile prep and drapes were then applied and a timeout was then observed.    Once the timeout was performed we made a linear incision in the line from the olecranon to the spine of the scapula.  We made the center point of this the intersection between this line and the hatch of the deltoid.  This helped [place] to identify the quadrangular space.  Skin and subcutaneous tissues were infiltrated with local anesthetic.  The skin was opened sharply.  It was brought down to the level of the triceps fascia.  No nerves were sacrificed in the spirit of the exposure.  We were able to then clearly see the triceps underneath.  We opened the triceps fascia sharply.  We are able to see the heads of the triceps and followed the plane in between to identify the radial nerve in the triangular space.  Once we are able to do this we then followed this proximally until we encountered the teres major.  We followed the teres major even more proximally until we entered the quadrangular space.  We were able to clearly palpate the circumflex artery as well as the axillary nerves.  We are then able to turn our attention to radial nerve motor harvest.  We were able to identify a small singular fascicle going to the medial head of the triceps.  We stimulated this with the intraoperative nerve stimulator under microscopic view.  We have followed this distally until it had started to go back around the humerus and was no longer able to be clearly identified.  We again stimulated this to rule out any wrist extensor involvement.  Once we were able to clearly identify that this was a isolated motor fascicle we divided it distally.  We then continued to dissect and intrafascicular dissection under microscopic assistance in order to maximize the length of nerve available for transfer.  We rotated this back towards the quadrangular space.  We then performed an epi neurotomy in the anterior group of  fascicles to the deltoid.  Using epineurial stitches we performed an end to side coaptation.  This was then reinforced with tissue glue.  We then irrigated copiously.  We got hemostasis meticulously.  We then closed in multiple layers.  Sterile dressing was applied.    Patient was then rotated back to the preoperative bed awakened from anesthesia and taken to recovery all counts are correct in this case.  I performed the entire procedure with the assistance of Manning Charity PA as an Designer, television/film set. An assistant was required for this procedure due to the complexity.  The assistant provided assistance in tissue manipulation and suction, and was required for the successful and safe performance of the procedure. I performed the critical portions of the procedure.   Lovenia Kim, MD

## 2023-01-06 NOTE — Transfer of Care (Signed)
Immediate Anesthesia Transfer of Care Note  Patient: Joel [name]  Procedure(s) Performed: RIGHT RADIAL TO AXILLARY END TO SIDE NERVE TRANSFER (Right)  Patient Location: PACU  Anesthesia Type:General  Level of Consciousness: awake and alert   Airway & Oxygen Therapy: Patient Spontanous Breathing and Patient connected to nasal cannula oxygen  Post-op Assessment: Report given to RN and Post -op Vital signs reviewed and stable  Post vital signs: Reviewed and stable  Last Vitals:  Vitals Value Taken Time  BP 124/76 01/06/23 1202  Temp 36 C 01/06/23 1202  Pulse 88 01/06/23 1205  Resp 14 01/06/23 1205  SpO2 100 % 01/06/23 1205  Vitals shown include unfiled device data.  Last Pain:  Vitals:   01/06/23 0833  TempSrc: Oral  PainSc: 0-No pain         Complications: No notable events documented.

## 2023-01-06 NOTE — Discharge Summary (Signed)
Discharge Summary  Patient ID: JOANDRY WOLZ MRN: [id number] DOB/AGE: [birth year] 77 y.o.  Admit date: 01/06/2023 Discharge date: 01/06/2023  Admission Diagnoses: G54.0 Brachial plexopathy R29.898 Right arm weakness. Discharge Diagnoses:  Active Problems:   Right arm weakness   Brachial plexopathy   Discharged Condition: good  Hospital Course:  Vanessa Gunkel is a 77 y.o presenting with right brachial plexopathy s/p right radial to axillary end to side nerve transfer. His intraoperative course was uncomplicated. He was monitored in PACU and discharged home after meeting post-operative goals. He was given a prescription of Oxycodone to take as needed for pain  Consults: None  Significant Diagnostic Studies: none  Treatments: surgery: as above. Please see separately dictated operative report for further detail  Discharge Exam: Blood pressure (!) 163/62, pulse 72, temperature 97.7 F (36.5 C), temperature source Oral, resp. rate 18, height 5\' 5"  (1.651 m), weight 81.6 kg, SpO2 100%. A&Ox3. Strength: Unchanged from pre-op Incision with clean post-op dressing in place  Disposition: Discharge disposition: 01-Home or Self Care        Allergies as of 01/06/2023   No Known Allergies      Medication List     STOP taking these medications    simvastatin 20 MG tablet Commonly known as: ZOCOR       TAKE these medications    acetaminophen 500 MG tablet Commonly known as: TYLENOL Take 500 mg by mouth every 6 (six) hours as needed.   amLODipine 10 MG tablet Commonly known as: NORVASC Take 1 tablet (10 mg total) by mouth daily.   aspirin 81 MG tablet Take 81 mg by mouth daily.   CALCIUM PO Take by mouth.   cholecalciferol 25 MCG (1000 UNIT) tablet Commonly known as: VITAMIN D3 Take 1,000 Units by mouth daily.   CINNAMON PO Take by mouth.   co-enzyme Q-10 30 MG capsule Take 30 mg by mouth 3 (three) times daily.   finasteride 5 MG tablet Commonly  known as: PROSCAR Take 5 mg by mouth daily.   lisinopril 10 MG tablet Commonly known as: ZESTRIL TAKE 1 TABLET BY MOUTH EVERY DAY   multivitamin capsule Take 1 capsule by mouth daily.   OMEGA-3-6-9 PO Take by mouth.   oxyCODONE 5 MG immediate release tablet Commonly known as: Roxicodone Take 1 tablet (5 mg total) by mouth every 4 (four) hours as needed for up to 5 days.   tamsulosin 0.4 MG Caps capsule Commonly known as: FLOMAX Take 2 capsules by mouth daily.        Follow-up Information     Drake Leach, PA-C Follow up on 01/19/2023.   Specialty: Neurosurgery Contact information: [address] [place] [address] [phone]                 Signed: Susanne Borders 01/06/2023, 11:56 AM

## 2023-01-06 NOTE — Anesthesia Procedure Notes (Signed)
Procedure Name: Intubation Date/Time: 01/06/2023 9:53 AM  Performed by: Velva Harman, RNPre-anesthesia Checklist: Patient identified, Patient being monitored, Timeout performed, Emergency Drugs available and Suction available Patient Re-evaluated:Patient Re-evaluated prior to induction Oxygen Delivery Method: Circle system utilized Preoxygenation: Pre-oxygenation with 100% oxygen Induction Type: IV induction Ventilation: Mask ventilation without difficulty Laryngoscope Size: McGraph and 4 Grade View: Grade I Tube type: Oral Tube size: 7.0 mm Number of attempts: 1 Airway Equipment and Method: Stylet Placement Confirmation: ETT inserted through vocal cords under direct vision, positive ETCO2 and breath sounds checked- equal and bilateral Secured at: 22 cm Tube secured with: Tape Dental Injury: Teeth and Oropharynx as per pre-operative assessment

## 2023-01-06 NOTE — Discharge Instructions (Addendum)
NEUROSURGERY DISCHARGE INSTRUCTIONS  Admission diagnosis: G54.0 Brachial plexopathy R29.898 Right arm weakness  Operative procedure: RIGHT RADIAL TO AXILLARY END TO SIDE NERVE TRANSFER   What to do after you leave the hospital:  Recommended diet: regular diet. Increase protein intake to promote wound healing.  Recommended activity: activity as tolerated. No driving while on pain medication.You should walk multiple times per day  Special Instructions  No straining, no heavy lifting > 10lbs x 4 weeks.  Keep incision area clean and dry. May shower in 2 days. No baths or pools for 6 weeks.  Please remove dressing tomorrow, no need to apply a bandage afterwards  You have no sutures to remove, the skin is closed with adhesive  Please take pain medications as directed. Take a stool softener if on pain medications   Please Report any of the following: Nausea or Vomiting, Temperature is greater than 101.71F (38.1C) degrees, Dizziness, Abdominal Pain, Difficulty Breathing or Shortness of Breath, Inability to Eat, drink Fluids, or Take medications, Bleeding, swelling, or drainage from surgical incision sites, New numbness or weakness, and Bowel or bladder dysfunction to the neurosurgeon on call. How to contact us:  If you have any questions/concerns before or after surgery, you can reach [place] at [phone], or you can send a mychart message. We can be reached by phone or mychart 8am-4pm, Monday-Friday.  *Please note: Calls after 4pm are forwarded to a third party answering service. Mychart messages are not routinely monitored during evenings, weekends, and holidays. Please call our office to contact the answering service for urgent concerns during non-business hours.   Additional Follow up appointments Please follow up with Drake Leach PA-C as scheduled in 2-3 weeks   Please see below for scheduled appointments:  Future Appointments  Date Time Provider Department Center  01/19/2023  1:30 PM  Drake Leach, PA-C [facility] None  02/10/2023  8:00 AM [facility]  02/16/2023  1:30 PM Lovenia Kim, MD [facility] None  02/17/2023  8:00 AM Karie Georges, MD [facility]  03/30/2023  1:30 PM Drake Leach, PA-C [facility] None    AMBULATORY SURGERY  DISCHARGE INSTRUCTIONS   The drugs that you were given will stay in your system until tomorrow so for the next 24 hours you should not:  Drive an automobile Make any legal decisions Drink any alcoholic beverage   You may resume regular meals tomorrow.  Today it is better to start with liquids and gradually work up to solid foods.  You may eat anything you prefer, but it is better to start with liquids, then soup and crackers, and gradually work up to solid foods.   Please notify your doctor immediately if you have any unusual bleeding, trouble breathing, redness and pain at the surgery site, drainage, fever, or pain not relieved by medication.    Additional Instructions:        Please contact your physician with any problems or Same Day Surgery at [phone], Monday through Friday 6 am to 4 pm, or  at [facility] number at [phone].

## 2023-01-06 NOTE — Anesthesia Postprocedure Evaluation (Signed)
Anesthesia Post Note  Patient: SHAMAL UPSHAW  Procedure(s) Performed: RIGHT RADIAL TO AXILLARY END TO SIDE NERVE TRANSFER (Right)  Patient location during evaluation: PACU Anesthesia Type: General Level of consciousness: awake and alert Pain management: pain level controlled Vital Signs Assessment: post-procedure vital signs reviewed and stable Respiratory status: spontaneous breathing, nonlabored ventilation, respiratory function stable and patient connected to nasal cannula oxygen Cardiovascular status: blood pressure returned to baseline and stable Postop Assessment: no apparent nausea or vomiting Anesthetic complications: no   No notable events documented.   Last Vitals:  Vitals:   01/06/23 1230 01/06/23 1310  BP: 125/79 130/73  Pulse: 69 70  Resp: 15 16  Temp:  (!) 36.2 C  SpO2: 96% 97%    Last Pain:  Vitals:   01/06/23 1310  TempSrc:   PainSc: 0-No pain                 Corinda Gubler

## 2023-01-06 NOTE — Anesthesia Preprocedure Evaluation (Signed)
Anesthesia Evaluation  Patient identified by MRN, date of birth, ID band Patient awake    Reviewed: Allergy & Precautions, NPO status , Patient's Chart, lab work & pertinent test results  History of Anesthesia Complications Negative for: history of anesthetic complications  Airway Mallampati: III  TM Distance: >3 FB Neck ROM: Full    Dental  (+) Chipped, Implants   Pulmonary neg pulmonary ROS, neg sleep apnea, neg COPD, Patient abstained from smoking.Not current smoker   Pulmonary exam normal breath sounds clear to auscultation       Cardiovascular Exercise Tolerance: Good METShypertension, Pt. on medications (-) CAD and (-) Past MI (-) dysrhythmias  Rhythm:Regular Rate:Normal - Systolic murmurs    Neuro/Psych  Neuromuscular disease  negative psych ROS   GI/Hepatic ,neg GERD  ,,(+)     (-) substance abuse    Endo/Other  neg diabetes    Renal/GU negative Renal ROS     Musculoskeletal   Abdominal   Peds  Hematology   Anesthesia Other Findings Past Medical History: No date: Arthritis 05/07/2009: BENIGN PROSTATIC HYPERTROPHY No date: Brachial plexopathy 05/07/2009: COLONIC POLYPS, HX OF 05/07/2009: HYPERLIPIDEMIA 05/07/2009: HYPERTENSION  Reproductive/Obstetrics                             Anesthesia Physical Anesthesia Plan  ASA: 2  Anesthesia Plan: General   Post-op Pain Management:    Induction: Intravenous  PONV Risk Score and Plan: 2 and Ondansetron and Dexamethasone  Airway Management Planned: Oral ETT and Video Laryngoscope Planned  Additional Equipment: None  Intra-op Plan:   Post-operative Plan: Extubation in OR  Informed Consent: I have reviewed the patients History and Physical, chart, labs and discussed the procedure including the risks, benefits and alternatives for the proposed anesthesia with the patient or authorized representative who has indicated his/her  understanding and acceptance.     Dental advisory given  Plan Discussed with: CRNA and Surgeon  Anesthesia Plan Comments: (Discussed risks of anesthesia with patient, including PONV, sore throat, lip/dental/eye damage. Rare risks discussed as well, such as cardiorespiratory and neurological sequelae, and allergic reactions. Discussed the role of CRNA in patient's perioperative care. Patient understands.)       Anesthesia Quick Evaluation

## 2023-01-06 NOTE — Interval H&P Note (Signed)
History and Physical Interval Note:  01/06/2023 9:22 AM  Joel [name]  has presented today for surgery, with the diagnosis of G54.0 Brachial plexopathy R29.898 Right arm weakness.  The various methods of treatment have been discussed with the patient and family. After consideration of risks, benefits and other options for treatment, the patient has consented to  Procedure(s): RIGHT RADIAL TO AXILLARY END TO SIDE NERVE TRANSFER (Right) as a surgical intervention.  The patient's history has been reviewed, patient examined, no change in status, stable for surgery.  I have reviewed the patient's chart and labs.  Questions were answered to the patient's satisfaction.    Heart and lungs were clear on examination today.    Lovenia Kim

## 2023-01-07 ENCOUNTER — Encounter: Payer: Self-pay | Admitting: Neurosurgery

## 2023-01-13 ENCOUNTER — Telehealth: Payer: Self-pay | Admitting: Neurosurgery

## 2023-01-13 NOTE — Telephone Encounter (Addendum)
Per discharge instructions, he can remove bandage the day after surgery and does not need to apply another one. I have notified the patient of this.

## 2023-01-13 NOTE — Telephone Encounter (Signed)
Patient is calling to ask if he needs to leave the waterproof bandage on until it comes off on it's own, or at what point does he need to remove it?

## 2023-01-16 NOTE — Progress Notes (Unsigned)
   REFERRING PHYSICIAN:  Karie Georges, Md [address] [place],  [place] [address]  DOS: 01/06/23 right radial to axillary nerve transfer end to side  HISTORY OF PRESENT ILLNESS: Joel [name] is about 2 weeks status post above surgery. Given oxycodone on discharge from the hospital.   He has minimal incisional pain. No numbness or tingling. Still with weakness in right arm- has not noticed a change yet.   Did not take any oxycodone.    PHYSICAL EXAMINATION:  NEUROLOGICAL:  General: In no acute distress.   Awake, alert, oriented to person, place, and time.  Pupils equal round and reactive to light.  Facial tone is symmetric.    Strength: Side Biceps Triceps Deltoid Interossei Grip Wrist Ext. Wrist Flex.  R 4- 4+ 2 5 5 5 5   L 5 5 5 5 5 5 5    Incision c/d/i  Imaging:  Nothing new to review.   Assessment / Plan: WETZEL MUHS is doing well s/p above surgery. Treatment options reviewed with patient and following plan made with Dr. Katrinka Blazing:   - Can return to activity as tolerated. No restrictions.  - Reviewed wound care.  - May consider PT at his follow up.  - Follow up as scheduled in 4 weeks and prn.   BP was elevated.  No symptoms of chest pain, shortness of breath, blurry vision, or headaches. Will recheck at home and call PCP if not improved. If he develops CP, SOB, blurry vision, or headaches, then he will go to ED.     Advised to contact the office if any questions or concerns arise.   Drake Leach PA-C Dept of Neurosurgery

## 2023-01-19 ENCOUNTER — Ambulatory Visit (INDEPENDENT_AMBULATORY_CARE_PROVIDER_SITE_OTHER): Payer: Medicare HMO | Admitting: Orthopedic Surgery

## 2023-01-19 ENCOUNTER — Encounter: Payer: Self-pay | Admitting: Orthopedic Surgery

## 2023-01-19 VITALS — BP 140/78 | Temp 97.7°F | Ht 65.0 in | Wt 180.0 lb

## 2023-01-19 DIAGNOSIS — Z09 Encounter for follow-up examination after completed treatment for conditions other than malignant neoplasm: Secondary | ICD-10-CM

## 2023-01-19 DIAGNOSIS — R29898 Other symptoms and signs involving the musculoskeletal system: Secondary | ICD-10-CM

## 2023-01-31 DIAGNOSIS — R69 Illness, unspecified: Secondary | ICD-10-CM | POA: Diagnosis not present

## 2023-02-09 DIAGNOSIS — L821 Other seborrheic keratosis: Secondary | ICD-10-CM | POA: Diagnosis not present

## 2023-02-09 DIAGNOSIS — L308 Other specified dermatitis: Secondary | ICD-10-CM | POA: Diagnosis not present

## 2023-02-09 DIAGNOSIS — Z85828 Personal history of other malignant neoplasm of skin: Secondary | ICD-10-CM | POA: Diagnosis not present

## 2023-02-09 DIAGNOSIS — C44612 Basal cell carcinoma of skin of right upper limb, including shoulder: Secondary | ICD-10-CM | POA: Diagnosis not present

## 2023-02-09 DIAGNOSIS — L570 Actinic keratosis: Secondary | ICD-10-CM | POA: Diagnosis not present

## 2023-02-10 ENCOUNTER — Other Ambulatory Visit (INDEPENDENT_AMBULATORY_CARE_PROVIDER_SITE_OTHER): Payer: Medicare HMO

## 2023-02-10 ENCOUNTER — Other Ambulatory Visit: Payer: Self-pay | Admitting: Family Medicine

## 2023-02-10 DIAGNOSIS — E785 Hyperlipidemia, unspecified: Secondary | ICD-10-CM

## 2023-02-10 LAB — LIPID PANEL
Cholesterol: 197 mg/dL (ref 0–200)
HDL: 62.9 mg/dL (ref >39.00)
LDL Cholesterol: 120 mg/dL — ABNORMAL HIGH (ref 0–99)
NonHDL: 134.4
Total CHOL/HDL Ratio: 3
Triglycerides: 72 mg/dL (ref 0.0–149.0)
VLDL: 14.4 mg/dL (ref 0.0–40.0)

## 2023-02-10 LAB — COMPREHENSIVE METABOLIC PANEL
ALT: 24 U/L (ref 0–53)
AST: 20 U/L (ref 0–37)
Albumin: 4.3 g/dL (ref 3.5–5.2)
Alkaline Phosphatase: 63 U/L (ref 39–117)
BUN: 20 mg/dL (ref 6–23)
CO2: 30 meq/L (ref 19–32)
Calcium: 9.1 mg/dL (ref 8.4–10.5)
Chloride: 106 meq/L (ref 96–112)
Creatinine, Ser: 1.09 mg/dL (ref 0.40–1.50)
GFR: 65.88 mL/min (ref >60.00)
Glucose, Bld: 101 mg/dL — ABNORMAL HIGH (ref 70–99)
Potassium: 4.1 meq/L (ref 3.5–5.1)
Sodium: 141 meq/L (ref 135–145)
Total Bilirubin: 0.4 mg/dL (ref 0.2–1.2)
Total Protein: 6.7 g/dL (ref 6.0–8.3)

## 2023-02-16 ENCOUNTER — Encounter: Payer: Self-pay | Admitting: Neurosurgery

## 2023-02-16 ENCOUNTER — Ambulatory Visit: Payer: Medicare HMO | Admitting: Neurosurgery

## 2023-02-16 VITALS — BP 128/78 | Temp 97.8°F | Ht 65.0 in | Wt 180.0 lb

## 2023-02-16 DIAGNOSIS — Z09 Encounter for follow-up examination after completed treatment for conditions other than malignant neoplasm: Secondary | ICD-10-CM

## 2023-02-16 DIAGNOSIS — R29898 Other symptoms and signs involving the musculoskeletal system: Secondary | ICD-10-CM

## 2023-02-16 NOTE — Progress Notes (Signed)
   REFERRING PHYSICIAN:  Karie Georges, Md [address] [place],  [place] [address]  DOS: 01/06/23 right radial to axillary nerve transfer end to side  HISTORY OF PRESENT ILLNESS: BENFORD PONIATOWSKI is about 2 weeks status post above surgery. Given oxycodone on discharge from the hospital.   He has minimal incisional pain. No numbness or tingling. Still with weakness in right arm- has not noticed a change yet.   Did not take any oxycodone.    PHYSICAL EXAMINATION:  NEUROLOGICAL:  General: In no acute distress.   Awake, alert, oriented to person, place, and time.  Pupils equal round and reactive to light.  Facial tone is symmetric.    Strength: He shows strong activation of the lateral head of the deltoid.  He is able to activate and turn this off volitionally.  This is giving him some lateral/abduction of the shoulder within the limits of his decreased passive range of motion/joint mobility.  There is some mild activation noted in the anterior head of the deltoid, however this is much less than the lateral head.   Incision c/d/i  Imaging:  Nothing new to review.   Assessment / Plan: SHIVAN ENERSON is doing well s/p above surgery.   -Plan to have him work with physical therapy, there is a local physical therapist who has worked previously with nerve transfer patients. - Follow up as scheduled in 4 weeks and prn.    Advised to contact the office if any questions or concerns arise.   Lovenia Kim, MD Dept of Neurosurgery

## 2023-02-17 ENCOUNTER — Encounter: Payer: Self-pay | Admitting: Family Medicine

## 2023-02-17 ENCOUNTER — Ambulatory Visit: Payer: Medicare HMO | Admitting: Family Medicine

## 2023-02-17 VITALS — BP 150/90 | HR 85 | Temp 97.5°F | Ht 65.0 in | Wt 188.0 lb

## 2023-02-17 DIAGNOSIS — I2510 Atherosclerotic heart disease of native coronary artery without angina pectoris: Secondary | ICD-10-CM

## 2023-02-17 DIAGNOSIS — Z23 Encounter for immunization: Secondary | ICD-10-CM | POA: Diagnosis not present

## 2023-02-17 DIAGNOSIS — I10 Essential (primary) hypertension: Secondary | ICD-10-CM

## 2023-02-17 DIAGNOSIS — E785 Hyperlipidemia, unspecified: Secondary | ICD-10-CM

## 2023-02-17 DIAGNOSIS — Z789 Other specified health status: Secondary | ICD-10-CM

## 2023-02-17 DIAGNOSIS — R739 Hyperglycemia, unspecified: Secondary | ICD-10-CM | POA: Diagnosis not present

## 2023-02-17 LAB — POCT GLYCOSYLATED HEMOGLOBIN (HGB A1C): Hemoglobin A1C: 5.6 % (ref 4.0–5.6)

## 2023-02-17 MED ORDER — EZETIMIBE 10 MG PO TABS: 10.0000 mg | ORAL_TABLET | Freq: Every day | ORAL | 1 refills | Status: DC

## 2023-02-17 NOTE — Assessment & Plan Note (Signed)
Chronic, usually controlled, however today pt did not take his medication this morning. I encouraged pt to switch his amlodipine to nighttime and continue lisinopril in the morning to get 24 hr coverage of his blood pressure. Will continue meds as prescribed, no changes today. Current hypertension medications:       Sig   amLODipine (NORVASC) 10 MG tablet (Taking) Take 1 tablet (10 mg total) by mouth daily.   lisinopril (ZESTRIL) 10 MG tablet (Taking) TAKE 1 TABLET BY MOUTH EVERY DAY

## 2023-02-17 NOTE — Assessment & Plan Note (Signed)
Intolerate of statin medications, his 10 year CVD risk score is >20%. I recommended getting a CT calcium score to better assess his overall cardiac risk and if needed will send to cardiology. We discussed starting zetia to help lower his cholesterol, however pt wants to get the CT first before starting the medication.

## 2023-02-17 NOTE — Progress Notes (Signed)
Established Patient Office Visit  Subjective   Patient ID: Joel [name], male    DOB: 16-May-1945  Age: 77 y.o. MRN: [id number]  Chief Complaint  Patient presents with   Medical Management of Chronic Issues    Patient is here for 6 month follow up.Marland Kitchen  HLD-- patient reports he could not tolerate the statin medication, he felt very lethargic, did not feel like himself, no muscle weakness or cramps. We discussed getting a CT calcium score and switching to zetia, risks/benefits were discussed with the patient and he is agreeable to the testing.   HTN-- chronic, patient reports that he usually gets 125-135 systolic at home. Today BP is elevated in the office, pt states he did not take his medication this morning yet. He denies any dizziness, lightheadedness  or chest pain.   We reviewed his labs today, he had a slightly elevated blood sugar,  so today he was screened for DM-- A1C today is 5.6.    Current Outpatient Medications  Medication Instructions   amLODipine (NORVASC) 10 mg, Oral, Daily   aspirin 81 mg, Oral, Daily,     CALCIUM PO Oral   cholecalciferol (VITAMIN D3) 1,000 Units, Oral, Daily   CINNAMON PO Oral   co-enzyme Q-10 30 mg, Oral, 3 times daily   ezetimibe (ZETIA) 10 mg, Oral, Daily   finasteride (PROSCAR) 5 mg, Oral, Daily   lisinopril (ZESTRIL) 10 mg, Oral, Daily   Multiple Vitamin (MULTIVITAMIN) capsule 1 capsule, Oral, Daily   Omega 3-6-9 Fatty Acids (OMEGA-3-6-9 PO) Oral   tamsulosin (FLOMAX) 0.4 MG CAPS capsule 2 capsules, Oral, Daily    Patient Active Problem List   Diagnosis Date Noted   Statin intolerance 02/17/2023   Brachial plexopathy 01/06/2023   Right arm weakness 12/22/2022   Elevated PSA 10/27/2013   Dyslipidemia 05/07/2009   Essential hypertension 05/07/2009   BPH (benign prostatic hyperplasia) 05/07/2009   History of colonic polyps 05/07/2009      Review of Systems  All other systems reviewed and are negative.     Objective:      BP (!) 150/90 (BP Location: Left Arm, Patient Position: Sitting, Cuff Size: Normal)   Pulse 85   Temp (!) 97.5 F (36.4 C) (Oral)   Ht 5\' 5"  (1.651 m)   Wt 188 lb (85.3 kg)   SpO2 98%   BMI 31.28 kg/m  BP Readings from Last 3 Encounters:  02/17/23 (!) 150/90  02/16/23 128/78  01/19/23 (!) 140/78      Physical Exam Vitals reviewed.  Constitutional:      Appearance: Normal appearance. He is well-groomed and normal weight.  Eyes:     Extraocular Movements: Extraocular movements intact.     Conjunctiva/sclera: Conjunctivae normal.  Neck:     Thyroid: No thyromegaly.  Cardiovascular:     Rate and Rhythm: Normal rate and regular rhythm.     Heart sounds: S1 normal and S2 normal. No murmur heard. Pulmonary:     Effort: Pulmonary effort is normal.     Breath sounds: Normal breath sounds and air entry. No rales.  Abdominal:     General: Abdomen is flat. Bowel sounds are normal.  Musculoskeletal:     Right lower leg: No edema.     Left lower leg: No edema.  Neurological:     General: No focal deficit present.     Mental Status: He is alert and oriented to person, place, and time.     Gait: Gait is intact.  Psychiatric:        Mood and Affect: Mood and affect normal.      Results for orders placed or performed in visit on 02/17/23  POC HgB A1c  Result Value Ref Range   Hemoglobin A1C 5.6 4.0 - 5.6 %   HbA1c POC (<> result, manual entry)     HbA1c, POC (prediabetic range)     HbA1c, POC (controlled diabetic range)      Last metabolic panel Lab Results  Component Value Date   GLUCOSE 101 (H) 02/10/2023   NA 141 02/10/2023   K 4.1 02/10/2023   CL 106 02/10/2023   CO2 30 02/10/2023   BUN 20 02/10/2023   CREATININE 1.09 02/10/2023   GFR 65.88 02/10/2023   CALCIUM 9.1 02/10/2023   PROT 6.7 02/10/2023   ALBUMIN 4.3 02/10/2023   BILITOT 0.4 02/10/2023   ALKPHOS 63 02/10/2023   AST 20 02/10/2023   ALT 24 02/10/2023   ANIONGAP 9 12/30/2022   Last lipids Lab  Results  Component Value Date   CHOL 197 02/10/2023   HDL 62.90 02/10/2023   LDLCALC 120 (H) 02/10/2023   LDLDIRECT 137.6 09/17/2012   TRIG 72.0 02/10/2023   CHOLHDL 3 02/10/2023      The 16-XWRU ASCVD risk score (Arnett DK, et al., 2019) is: 35.5%    Assessment & Plan:  Need for immunization against influenza -     Flu Vaccine Trivalent High Dose (Fluad)  Hyperglycemia -     POCT glycosylated hemoglobin (Hb A1C) DM screening continues to be negative despite the slight elevation in the blood sugar on labs. 5.6 today.  Dyslipidemia Assessment & Plan: Intolerate of statin medications, his 10 year CVD risk score is >20%. I recommended getting a CT calcium score to better assess his overall cardiac risk and if needed will send to cardiology. We discussed starting zetia to help lower his cholesterol, however pt wants to get the CT first before starting the medication.   Orders: -     CT CARDIAC SCORING (SELF PAY ONLY); Future -     Ezetimibe; Take 1 tablet (10 mg total) by mouth daily.  Dispense: 90 tablet; Refill: 1  Statin intolerance -     CT CARDIAC SCORING (SELF PAY ONLY); Future  Essential hypertension Assessment & Plan: Chronic, usually controlled, however today pt did not take his medication this morning. I encouraged pt to switch his amlodipine to nighttime and continue lisinopril in the morning to get 24 hr coverage of his blood pressure. Will continue meds as prescribed, no changes today. Current hypertension medications:       Sig   amLODipine (NORVASC) 10 MG tablet (Taking) Take 1 tablet (10 mg total) by mouth daily.   lisinopril (ZESTRIL) 10 MG tablet (Taking) TAKE 1 TABLET BY MOUTH EVERY DAY            Return in about 6 months (around 08/18/2023) for HTN.    Karie Georges, MD

## 2023-02-17 NOTE — Patient Instructions (Signed)
Switch amlodipine to nighttime dosing, keep lisinopril in the morning.

## 2023-02-17 NOTE — Progress Notes (Deleted)
Complete physical exam  Patient: Joel [name]   DOB: [birth year]   77 y.o. Male  MRN: [id number]  Subjective:    Chief Complaint  Patient presents with   Medical Management of Chronic Issues    Joel [name] is a 77 y.o. male who presents today for a complete physical exam. He reports consuming a {diet types:17450} diet. {types:19826} He generally feels {DESC; WELL/FAIRLY WELL/POORLY:18703}. He reports sleeping {DESC; WELL/FAIRLY WELL/POORLY:18703}. He {does/does not:200015} have additional problems to discuss today.    Most recent fall risk assessment:    02/17/2023    8:00 AM  Fall Risk   Falls in the past year? 0  Number falls in past yr: 0  Injury with Fall? 0  Risk for fall due to : No Fall Risks  Follow up Falls evaluation completed     Most recent depression screenings:    02/17/2023    8:00 AM 08/11/2022   12:55 PM  PHQ 2/9 Scores  PHQ - 2 Score 0 0  PHQ- 9 Score 0     {VISON DENTAL STD PSA (Optional):27386}  {History (Optional):23778}  Patient Care Team: Karie Georges, MD as PCP - General (Family Medicine) Marcine Matar, MD as Consulting Physician (Urology) [place], Amy, MD as Consulting Physician (Dermatology)   Outpatient Medications Prior to Visit  Medication Sig   amLODipine (NORVASC) 10 MG tablet Take 1 tablet (10 mg total) by mouth daily.   aspirin 81 MG tablet Take 81 mg by mouth daily.   CALCIUM PO Take by mouth.   cholecalciferol (VITAMIN D3) 25 MCG (1000 UNIT) tablet Take 1,000 Units by mouth daily.   CINNAMON PO Take by mouth.   co-enzyme Q-10 30 MG capsule Take 30 mg by mouth 3 (three) times daily.   finasteride (PROSCAR) 5 MG tablet Take 5 mg by mouth daily.   lisinopril (ZESTRIL) 10 MG tablet TAKE 1 TABLET BY MOUTH EVERY DAY   Multiple Vitamin (MULTIVITAMIN) capsule Take 1 capsule by mouth daily.   Omega 3-6-9 Fatty Acids (OMEGA-3-6-9 PO) Take by mouth.   tamsulosin (FLOMAX) 0.4 MG CAPS capsule Take 2 capsules by mouth  daily.   No facility-administered medications prior to visit.    ROS     Objective:     BP (!) 150/90 (BP Location: Left Arm, Patient Position: Sitting, Cuff Size: Normal)   Pulse 85   Temp (!) 97.5 F (36.4 C) (Oral)   Ht 5\' 5"  (1.651 m)   Wt 188 lb (85.3 kg)   SpO2 98%   BMI 31.28 kg/m  {Vitals History (Optional):23777}  Physical Exam   No results found for any visits on 02/17/23. {Show previous labs (optional):23779}    Assessment & Plan:    Routine Health Maintenance and Physical Exam  Immunization History  Administered Date(s) Administered   Fluad Quad(high Dose 65+) 12/14/2018   Fluad Trivalent(High Dose 65+) 02/17/2023   Influenza Split 01/01/2011   Influenza Whole 01/08/2010   Influenza, High Dose Seasonal PF 01/13/2014, 02/22/2016, 01/11/2018   Influenza,inj,Quad PF,6+ Mos 03/20/2015   Influenza-Unspecified 02/22/2016, 01/28/2017, 12/14/2018, 12/20/2020, 12/13/2021   PFIZER(Purple Top)SARS-COV-2 Vaccination 04/07/2019, 05/05/2019, 12/13/2019, 04/10/2020   PNEUMOCOCCAL CONJUGATE-20 08/27/2021   Pfizer Covid-19 Vaccine Bivalent Booster 72yrs & up 03/18/2021, 08/27/2021, 12/13/2021   Pneumococcal Conjugate-13 10/27/2013   Pneumococcal Polysaccharide-23 09/24/2012   Td 01/08/2010   Tdap 08/08/2020   Zoster Recombinant(Shingrix) 09/22/2018, 12/14/2018   Zoster, Live 01/08/2010    Health Maintenance  Topic Date Due   Medicare Annual  Wellness (AWV)  08/23/2022   COVID-19 Vaccine (8 - 2023-24 season) 03/05/2023 (Originally 11/09/2022)   DTaP/Tdap/Td (3 - Td or Tdap) 08/09/2030   Pneumonia Vaccine 86+ Years old  Completed   INFLUENZA VACCINE  Completed   Hepatitis C Screening  Completed   Zoster Vaccines- Shingrix  Completed   HPV VACCINES  Aged Out   Colonoscopy  Discontinued    Discussed health benefits of physical activity, and encouraged him to engage in regular exercise appropriate for his age and condition.  Need for immunization against  influenza -     Flu Vaccine Trivalent High Dose (Fluad)    No follow-ups on file.     Karie Georges, MD

## 2023-02-26 ENCOUNTER — Ambulatory Visit (HOSPITAL_COMMUNITY)
Admission: RE | Admit: 2023-02-26 | Discharge: 2023-02-26 | Disposition: A | Discharge status: Home / Self Care | Payer: Self-pay | Source: Ambulatory Visit | Attending: Family Medicine | Admitting: Family Medicine

## 2023-02-26 DIAGNOSIS — Z789 Other specified health status: Secondary | ICD-10-CM | POA: Insufficient documentation

## 2023-02-26 DIAGNOSIS — E785 Hyperlipidemia, unspecified: Secondary | ICD-10-CM | POA: Insufficient documentation

## 2023-02-27 ENCOUNTER — Encounter: Payer: Self-pay | Admitting: Family Medicine

## 2023-02-27 ENCOUNTER — Other Ambulatory Visit: Payer: Self-pay | Admitting: Neurosurgery

## 2023-02-27 DIAGNOSIS — G540 Brachial plexus disorders: Secondary | ICD-10-CM | POA: Diagnosis not present

## 2023-02-27 DIAGNOSIS — R29898 Other symptoms and signs involving the musculoskeletal system: Secondary | ICD-10-CM

## 2023-02-27 DIAGNOSIS — M25511 Pain in right shoulder: Secondary | ICD-10-CM | POA: Diagnosis not present

## 2023-02-27 DIAGNOSIS — M25611 Stiffness of right shoulder, not elsewhere classified: Secondary | ICD-10-CM | POA: Diagnosis not present

## 2023-02-27 NOTE — Addendum Note (Signed)
Addended by: Karie Georges on: 02/27/2023 08:14 AM   Modules accepted: Orders

## 2023-03-10 DIAGNOSIS — R69 Illness, unspecified: Secondary | ICD-10-CM | POA: Diagnosis not present

## 2023-03-15 NOTE — Progress Notes (Signed)
 Cardiology Office Note:  .   Date:  03/18/2023  ID:  Joel [name], DOB 06/19/1945, MRN [id number] PCP: [name], MD  [facility] Providers Cardiologist:  None { History of Present Illness: .   Joel [name] is a 78 y.o. male with history of CAD, HLD, HTN who presents for the evaluation of CAD at the request of [name], MD.   History of Present Illness   Joel [name], a 78 year old male with a history of hyperlipidemia and hypertension, presents for evaluation of an elevated coronary calcium  score of 1624 (85th percentile). He reports no chest pain or trouble breathing and maintains an active lifestyle, including regular exercise. He has been experiencing lethargy as a side effect of statin therapy for hyperlipidemia. His cholesterol level was last recorded at 197, with an LDL level around 120. Recently, he was started on Zetia  to manage his LDL levels. He has been on this medication for approximately three weeks. He has no history of heart attack or stroke, and his blood pressure is usually controlled, typically running in the 130s over 75 to 80. He has no known kidney issues and is not diabetic. His father had a history of heart attack and hypertension. He does not smoke and consumes alcohol moderately.          Problem List CAD -CAC 1624 (85th percentile) 2. HLD -T chol 197, HDL 62, LDL 120, TG 72 3. HTN    ROS: All other ROS reviewed and negative. Pertinent positives noted in the HPI.     Studies Reviewed: SABRA   EKG Interpretation Date/Time:  Wednesday March 18 2023 14:10:49 EST Ventricular Rate:  68 PR Interval:  174 QRS Duration:  106 QT Interval:  374 QTC Calculation: 397 R Axis:   -42  Text Interpretation: Normal sinus rhythm Left axis deviation Possible Anterior infarct , age undetermined Confirmed by Barbaraann Kotyk [phone]) on 03/18/2023 2:12:58 PM   Physical Exam:   VS:  BP (!) 154/76 (BP Location: Left Arm, Patient Position: Sitting, Cuff  Size: Normal)   Pulse 68   Ht 5' 5 (1.651 m)   Wt 191 lb 3.2 oz (86.7 kg)   SpO2 96%   BMI 31.82 kg/m    Wt Readings from Last 3 Encounters:  03/18/23 191 lb 3.2 oz (86.7 kg)  02/17/23 188 lb (85.3 kg)  02/16/23 180 lb (81.6 kg)    GEN: Well nourished, well developed in no acute distress NECK: No JVD; No carotid bruits CARDIAC: RRR, no murmurs, rubs, gallops RESPIRATORY:  Clear to auscultation without rales, wheezing or rhonchi  ABDOMEN: Soft, non-tender, non-distended EXTREMITIES:  No edema; No deformity  ASSESSMENT AND PLAN: .   Assessment and Plan    Coronary Artery Disease Elevated coronary calcium  score (1624, 85th percentile). No history of heart attack or stroke. No chest pain or trouble breathing reported. Abnormal EKG with nonspecific STT changes and a nonspecific IVCD. -Order cardiac PET stress test to exclude obstructive disease. Cannot exercise on treadmill due to abnormal EKG. Obese with BMI 31 so PET better than SPECT.  -Order echocardiogram to assess heart function. -Continue aspirin .  Carotid Bruit Abnormal sound detected in right carotid artery during physical examination. -Order carotid dopplers to assess for potential blockage.  Hyperlipidemia Statin intolerance reported. Currently on Zetia  10mg  daily for 3 weeks. LDL previously around 120. -Return in 3 weeks for fasting lipid panel and LPA check. -If lipids not at goal, consider starting Repatha .  Hypertension Blood  pressure slightly elevated during visit, usually controlled with readings in the 130s. -Continue current management.          Informed Consent   Shared Decision Making/Informed Consent The risks [stroke (1 in 1000), death (1 in 1000), kidney failure [usually temporary] (1 in 500), bleeding (1 in 200), allergic reaction [possibly serious] (1 in 200)], benefits (diagnostic support and management of coronary artery disease) and alternatives of a cardiac catheterization were discussed in  detail with Joel [name] and he is willing to proceed.      Follow-up: Return in about 1 year (around 03/17/2024).   Signed, Darryle DASEN. Barbaraann, MD, [facility]  [facility]  [address], [address] [place], [place] [address] [phone]  3:48 PM

## 2023-03-18 ENCOUNTER — Encounter: Payer: Self-pay | Admitting: Cardiovascular Disease

## 2023-03-18 ENCOUNTER — Ambulatory Visit: Payer: Medicare HMO | Attending: Cardiovascular Disease | Admitting: Cardiovascular Disease

## 2023-03-18 VITALS — BP 154/76 | HR 68 | Ht 65.0 in | Wt 191.2 lb

## 2023-03-18 DIAGNOSIS — E782 Mixed hyperlipidemia: Secondary | ICD-10-CM | POA: Diagnosis not present

## 2023-03-18 DIAGNOSIS — I2510 Atherosclerotic heart disease of native coronary artery without angina pectoris: Secondary | ICD-10-CM | POA: Diagnosis not present

## 2023-03-18 DIAGNOSIS — R0989 Other specified symptoms and signs involving the circulatory and respiratory systems: Secondary | ICD-10-CM | POA: Diagnosis not present

## 2023-03-18 DIAGNOSIS — I150 Renovascular hypertension: Secondary | ICD-10-CM

## 2023-03-18 DIAGNOSIS — R931 Abnormal findings on diagnostic imaging of heart and coronary circulation: Secondary | ICD-10-CM

## 2023-03-18 NOTE — Patient Instructions (Signed)
 Medication Instructions:  No changes *If you need a refill on your cardiac medications before your next appointment, please call your pharmacy*   Lab Work: Lipid panel and LPa- Please return for Blood Work in 3 weeks- fasting labs. No appointment needed, lab here at the office is open Monday-Friday from 8AM to 4PM and closed daily for lunch from 12:45-1:45.   If you have labs (blood work) drawn today and your tests are completely normal, you will receive your results only by: MyChart Message (if you have MyChart) OR A paper copy in the mail If you have any lab test that is abnormal or we need to change your treatment, we will call you to review the results.   Testing/Procedures: Your physician has requested that you have an echocardiogram. Echocardiography is a painless test that uses sound waves to create images of your heart. It provides your doctor with information about the size and shape of your heart and how well your heart's chambers and valves are working. This procedure takes approximately one hour. There are no restrictions for this procedure. Please do NOT wear cologne, perfume, aftershave, or lotions (deodorant is allowed). Please arrive 15 minutes prior to your appointment time.  Please note: We ask at that you not bring children with you during ultrasound (echo/ vascular) testing. Due to room size and safety concerns, children are not allowed in the ultrasound rooms during exams. Our front office staff cannot provide observation of children in our lobby area while testing is being conducted. An adult accompanying a patient to their appointment will only be allowed in the ultrasound room at the discretion of the ultrasound technician under special circumstances. We apologize for any inconvenience.   Your physician has requested that you have a carotid duplex. This test is an ultrasound of the carotid arteries in your neck. It looks at blood flow through these arteries that supply the  brain with blood. Allow one hour for this exam. There are no restrictions or special instructions.      Please report to Radiology at the [facility] Main Entrance 30 minutes early for your test.  [address] [place], [place] [address]   How to Prepare for Your Cardiac PET/CT Stress Test:  Nothing to eat or drink, except water, 3 hours prior to arrival time.  NO caffeine/decaffeinated products, or chocolate 12 hours prior to arrival. (Please note decaffeinated beverages (teas/coffees) still contain caffeine).  If you have caffeine within 12 hours prior, the test will need to be rescheduled.     You may take your normal medications with water.  NO perfume, cologne or lotion on chest or abdomen area.   Total time is 1 to 2 hours; you may want to bring reading material for the waiting time.  IF YOU THINK YOU MAY BE PREGNANT, OR ARE NURSING PLEASE INFORM THE TECHNOLOGIST.  In preparation for your appointment, medication and supplies will be purchased.  Appointment availability is limited, so if you need to cancel or reschedule, please call the Radiology Department at [phone] Ger[facility] Law) OR [phone] [facility]) 24 hours in advance to avoid a cancellation fee of $100.00  What to Expect When you Arrive:  Once you arrive and check in for your appointment, you will be taken to a preparation room within the Radiology Department.  A technologist or Nurse will obtain your medical history, verify that you are correctly prepped for the exam, and explain the procedure.  Afterwards, an IV will be started in  your arm and electrodes will be placed on your skin for EKG monitoring during the stress portion of the exam. Then you will be escorted to the PET/CT scanner.  There, staff will get you positioned on the scanner and obtain a blood pressure and EKG.  During the exam, you will continue to be connected to the EKG and blood pressure machines.  A small, safe amount of a radioactive  tracer will be injected in your IV to obtain a series of pictures of your heart along with an injection of a stress agent.    After your Exam:  It is recommended that you eat a meal and drink a caffeinated beverage to counter act any effects of the stress agent.  Drink plenty of fluids for the remainder of the day and urinate frequently for the first couple of hours after the exam.  Your doctor will inform you of your test results within 7-10 business days.  For more information and frequently asked questions, please visit our website: [email or web address]  For questions about your test or how to prepare for your test, please call: Cardiac Imaging Nurse Navigators Office: [phone]    Follow-Up: At [facility], you and your health needs are our priority.  As part of our continuing mission to provide you with exceptional heart care, we have created designated Provider Care Teams.  These Care Teams include your primary Cardiologist (physician) and Advanced Practice Providers (APPs -  Physician Assistants and Nurse Practitioners) who all work together to provide you with the care you need, when you need it.  We recommend signing up for the patient portal called MyChart.  Sign up information is provided on this After Visit Summary.  MyChart is used to connect with patients for Virtual Visits (Telemedicine).  Patients are able to view lab/test results, encounter notes, upcoming appointments, etc.  Non-urgent messages can be sent to your provider as well.   To learn more about what you can do with MyChart, go to [email or web address].    Your next appointment:   1 year(s)  Provider:   Dr Barbaraann

## 2023-03-23 DIAGNOSIS — G540 Brachial plexus disorders: Secondary | ICD-10-CM | POA: Diagnosis not present

## 2023-03-23 DIAGNOSIS — M25611 Stiffness of right shoulder, not elsewhere classified: Secondary | ICD-10-CM | POA: Diagnosis not present

## 2023-03-23 DIAGNOSIS — M25511 Pain in right shoulder: Secondary | ICD-10-CM | POA: Diagnosis not present

## 2023-03-25 DIAGNOSIS — C44729 Squamous cell carcinoma of skin of left lower limb, including hip: Secondary | ICD-10-CM | POA: Diagnosis not present

## 2023-03-26 NOTE — Progress Notes (Signed)
   REFERRING PHYSICIAN:  Karie Georges, Md [address] [place],  [place] [address]  DOS: 01/06/23 right radial to axillary nerve transfer end to side  HISTORY OF PRESENT ILLNESS:  He was doing well at last visit with minimal pain. He still had weakness in right arm.   He was sent to PT at his last visit. He is doing his exercises at home. No pain in right arm. Thinks weakness may be a little better.   He has no pain, but still has weakness in the right arm. He can get right arm up to wash his hair without assistance.    PHYSICAL EXAMINATION:  NEUROLOGICAL:  General: In no acute distress.   Awake, alert, oriented to person, place, and time.  Pupils equal round and reactive to light.  Facial tone is symmetric.    Strength: Side Biceps Triceps Deltoid Interossei Grip Wrist Ext. Wrist Flex.  R 4+ 5 2 5 5 5 5   L 5 5 5 5 5 5 5    Incision well healed  Imaging:  Nothing new to review.   Assessment / Plan: CHASITY BLUE is doing well s/p above surgery. Treatment options reviewed with patient and following plan made with Dr. Katrinka Blazing (after his visit):   - Continue with PT. Okay to do dry needling.  - Will watch slight weakness in right biceps for now.  - Still hopeful that weakness will improve with time.  - Follow up with Dr. Katrinka Blazing in 3 months and prn.   Patient sent message about above plan.    - Can return to activity as tolerated. No restrictions.  - Reviewed wound care.  - May consider PT at his follow up.  - Follow up as scheduled in 4 weeks and prn.   Advised to contact the office if any questions or concerns arise.   Drake Leach PA-C Dept of Neurosurgery

## 2023-03-29 ENCOUNTER — Other Ambulatory Visit: Payer: Self-pay | Admitting: Family Medicine

## 2023-03-30 ENCOUNTER — Ambulatory Visit (INDEPENDENT_AMBULATORY_CARE_PROVIDER_SITE_OTHER): Payer: Medicare HMO | Admitting: Orthopedic Surgery

## 2023-03-30 ENCOUNTER — Encounter: Payer: Self-pay | Admitting: Orthopedic Surgery

## 2023-03-30 VITALS — BP 138/78 | Ht 65.0 in | Wt 191.0 lb

## 2023-03-30 DIAGNOSIS — Z09 Encounter for follow-up examination after completed treatment for conditions other than malignant neoplasm: Secondary | ICD-10-CM

## 2023-03-30 DIAGNOSIS — R29898 Other symptoms and signs involving the musculoskeletal system: Secondary | ICD-10-CM

## 2023-04-06 DIAGNOSIS — M25511 Pain in right shoulder: Secondary | ICD-10-CM | POA: Diagnosis not present

## 2023-04-06 DIAGNOSIS — G540 Brachial plexus disorders: Secondary | ICD-10-CM | POA: Diagnosis not present

## 2023-04-06 DIAGNOSIS — M25611 Stiffness of right shoulder, not elsewhere classified: Secondary | ICD-10-CM | POA: Diagnosis not present

## 2023-04-10 DIAGNOSIS — E782 Mixed hyperlipidemia: Secondary | ICD-10-CM | POA: Diagnosis not present

## 2023-04-10 DIAGNOSIS — I2510 Atherosclerotic heart disease of native coronary artery without angina pectoris: Secondary | ICD-10-CM | POA: Diagnosis not present

## 2023-04-12 LAB — LIPID PANEL
Chol/HDL Ratio: 2.7 {ratio} (ref 0.0–5.0)
Cholesterol, Total: 194 mg/dL (ref 100–199)
HDL: 71 mg/dL (ref >39)
LDL Chol Calc (NIH): 109 mg/dL — ABNORMAL HIGH (ref 0–99)
Triglycerides: 75 mg/dL (ref 0–149)
VLDL Cholesterol Cal: 14 mg/dL (ref 5–40)

## 2023-04-12 LAB — LIPOPROTEIN A (LPA): Lipoprotein (a): 226 nmol/L — ABNORMAL HIGH (ref <75.0)

## 2023-04-13 ENCOUNTER — Encounter: Payer: Self-pay | Admitting: Cardiovascular Disease

## 2023-04-13 DIAGNOSIS — M75100 Unspecified rotator cuff tear or rupture of unspecified shoulder, not specified as traumatic: Secondary | ICD-10-CM | POA: Diagnosis not present

## 2023-04-14 ENCOUNTER — Other Ambulatory Visit: Payer: Self-pay

## 2023-04-14 DIAGNOSIS — E782 Mixed hyperlipidemia: Secondary | ICD-10-CM

## 2023-04-16 DIAGNOSIS — M75100 Unspecified rotator cuff tear or rupture of unspecified shoulder, not specified as traumatic: Secondary | ICD-10-CM | POA: Diagnosis not present

## 2023-04-21 ENCOUNTER — Encounter: Payer: Self-pay | Admitting: Cardiovascular Disease

## 2023-04-21 ENCOUNTER — Ambulatory Visit (HOSPITAL_COMMUNITY)
Admission: RE | Admit: 2023-04-21 | Discharge: 2023-04-21 | Disposition: A | Discharge status: Home / Self Care | Payer: Medicare HMO | Source: Ambulatory Visit | Attending: Cardiovascular Disease | Admitting: Cardiovascular Disease

## 2023-04-21 ENCOUNTER — Ambulatory Visit (HOSPITAL_BASED_OUTPATIENT_CLINIC_OR_DEPARTMENT_OTHER)
Admission: RE | Admit: 2023-04-21 | Discharge: 2023-04-21 | Disposition: A | Discharge status: Home / Self Care | Payer: Medicare HMO | Source: Ambulatory Visit | Attending: Cardiovascular Disease

## 2023-04-21 DIAGNOSIS — I2510 Atherosclerotic heart disease of native coronary artery without angina pectoris: Secondary | ICD-10-CM

## 2023-04-21 DIAGNOSIS — R0989 Other specified symptoms and signs involving the circulatory and respiratory systems: Secondary | ICD-10-CM | POA: Diagnosis not present

## 2023-04-21 DIAGNOSIS — R9431 Abnormal electrocardiogram [ECG] [EKG]: Secondary | ICD-10-CM | POA: Diagnosis not present

## 2023-04-21 DIAGNOSIS — E782 Mixed hyperlipidemia: Secondary | ICD-10-CM

## 2023-04-21 LAB — ECHOCARDIOGRAM COMPLETE
AR max vel: 2.94 cm2
AV Area VTI: 2.61 cm2
AV Area mean vel: 2.42 cm2
AV Mean grad: 4 mm[Hg]
AV Peak grad: 8.4 mm[Hg]
Ao pk vel: 1.45 m/s
Area-P 1/2: 4.19 cm2
S' Lateral: 2.94 cm

## 2023-04-21 MED ORDER — PERFLUTREN LIPID MICROSPHERE
1.0000 mL | INTRAVENOUS | Status: DC | PRN
  Administered 2023-04-21: 4 mL via INTRAVENOUS

## 2023-05-11 DIAGNOSIS — M75100 Unspecified rotator cuff tear or rupture of unspecified shoulder, not specified as traumatic: Secondary | ICD-10-CM | POA: Diagnosis not present

## 2023-05-13 ENCOUNTER — Other Ambulatory Visit: Payer: Self-pay | Admitting: Cardiovascular Disease

## 2023-05-13 MED ORDER — ATORVASTATIN CALCIUM 20 MG PO TABS: 20.0000 mg | ORAL_TABLET | Freq: Every day | ORAL | 3 refills | Status: DC

## 2023-05-13 NOTE — Progress Notes (Signed)
 Called in lipitor 20 mg daily.   Gerri Spore T. Flora Lipps, MD, [facility]  [facility]  [address] [place], [place] [address] [phone]  7:42 PM

## 2023-06-02 ENCOUNTER — Ambulatory Visit: Payer: Medicare HMO | Attending: Cardiology | Admitting: Pharmacist Clinician (PhC)/ Clinical Pharmacy Specialist

## 2023-06-02 ENCOUNTER — Encounter: Payer: Self-pay | Admitting: Pharmacist Clinician (PhC)/ Clinical Pharmacy Specialist

## 2023-06-02 DIAGNOSIS — E785 Hyperlipidemia, unspecified: Secondary | ICD-10-CM

## 2023-06-02 NOTE — Assessment & Plan Note (Signed)
 Assessment: Patient with ASCVD not at LDL goal of < 70 Most recent LDL 109 on 04/10/23 Has been compliant with moderate intensity statin: atorvastatin 20 mg  Not able to tolerate simvastatin secondary to lethargy Reviewed options for lowering LDL cholesterol, including addition of  PCSK-9 inhibitors.  Discussed mechanisms of action, dosing, side effects, potential decreases in LDL cholesterol and costs.  Also reviewed potential options for patient assistance.  Plan: Patient would like to think about Repatha before making a decision.  Reach out to me once he has decided.  Repeat labs after:  2-3 months Lipid Liver function Lp(a)

## 2023-06-02 NOTE — Progress Notes (Signed)
 Office Visit    Patient Name: Joel [name] Date of Encounter: 06/02/2023  Primary Care Provider:  Karie Georges, MD Primary Cardiologist:  None  Chief Complaint    Hyperlipidemia   Significant Past Medical History   CAD CAC = 1624 (85th percentile)  HTN Elevated at last visit, on      No Known Allergies  History of Present Illness    Joel [name] is a 78 y.o. male patient of Dr Joel [name], in the office today to discuss options for cholesterol management.  He was recently started on atorvastatin, and is tolerating without issue.  In addition to elevated LDL cholesterol, Joel [name] has an elevated CAC score as well as elevated Lp(a).    Insurance Carrier: [employer]  [id number]  Repatha 25% = $142/month  Healthwell:  does not qualify  LDL Cholesterol goal:  LDL < 70  Current Medications:   atorvastatin 20 mg daily - started March 7  Previously tried:  simvastatin - lethargy  Family Hx:   father had MI mid 77's, htn, mother died aneurysm  - aortic; 3 sisters healthy; 2 daughters healthy  Social Hx: Tobacco: no Alcohol: occasional   Accessory Clinical Findings   Lab Results  Component Value Date   CHOL 194 04/10/2023   HDL 71 04/10/2023   LDLCALC 109 (H) 04/10/2023   LDLDIRECT 137.6 09/17/2012   TRIG 75 04/10/2023   CHOLHDL 2.7 04/10/2023    Lipoprotein (a)  Date/Time Value Ref Range Status  04/10/2023 09:41 AM 226.0 (H) <75.0 nmol/L Final    Comment:    Note:  Values greater than or equal to 75.0 nmol/L may        indicate an independent risk factor for CHD,        but must be evaluated with caution when applied        to non-Caucasian populations due to the        influence of genetic factors on Lp(a) across        ethnicities.     Lab Results  Component Value Date   ALT 24 02/10/2023   AST 20 02/10/2023   ALKPHOS 63 02/10/2023   BILITOT 0.4 02/10/2023   Lab Results  Component Value Date   CREATININE 1.09 02/10/2023   BUN 20  02/10/2023   NA 141 02/10/2023   K 4.1 02/10/2023   CL 106 02/10/2023   CO2 30 02/10/2023   Lab Results  Component Value Date   HGBA1C 5.6 02/17/2023    Home Medications    Current Outpatient Medications  Medication Sig Dispense Refill   amLODipine (NORVASC) 10 MG tablet TAKE 1 TABLET BY MOUTH EVERY DAY 90 tablet 1   aspirin 81 MG tablet Take 81 mg by mouth daily.     atorvastatin (LIPITOR) 20 MG tablet Take 1 tablet (20 mg total) by mouth daily. 90 tablet 3   CINNAMON PO Take by mouth.     finasteride (PROSCAR) 5 MG tablet Take 5 mg by mouth daily.     lisinopril (ZESTRIL) 10 MG tablet TAKE 1 TABLET BY MOUTH EVERY DAY 90 tablet 1   Multiple Vitamin (MULTIVITAMIN) capsule Take 1 capsule by mouth daily.     Omega 3-6-9 Fatty Acids (OMEGA-3-6-9 PO) Take by mouth.     tamsulosin (FLOMAX) 0.4 MG CAPS capsule Take 2 capsules by mouth daily.     No current facility-administered medications for this visit.     Assessment & Plan  Dyslipidemia Assessment: Patient with ASCVD not at LDL goal of < 70 Most recent LDL 109 on 04/10/23 Has been compliant with moderate intensity statin: atorvastatin 20 mg  Not able to tolerate simvastatin secondary to lethargy Reviewed options for lowering LDL cholesterol, including addition of  PCSK-9 inhibitors.  Discussed mechanisms of action, dosing, side effects, potential decreases in LDL cholesterol and costs.  Also reviewed potential options for patient assistance.  Plan: Patient would like to think about Repatha before making a decision.  Reach out to me once he has decided.  Repeat labs after:  2-3 months Lipid Liver function Lp(a)    Phillips Hay, PharmD CPP [facility] [address]  [place], [place] [address] [phone]  06/02/2023, 3:04 PM

## 2023-06-02 NOTE — Patient Instructions (Signed)
 Your Results:             Your most recent labs Goal  Total Cholesterol 194 < 200  Triglycerides 75 < 150  HDL (happy/good cholesterol) 71 > 40  LDL (lousy/bad cholesterol 109 < 70  Lp(a) 226 < 75   Medication changes:  Continue with the atorvastatin.    Consider using Repatha to help lower the Lp(a).   If you want to use this, we will need to get authorization before we check cholesterol labs again.    Lab orders:  We want to repeat labs after 2-3 months.  We will send you a lab order to remind you once we get closer to that time.     Repatha is a cholesterol medication that improved your body's ability to get rid of "bad cholesterol" known as LDL. It can lower your LDL up to 60%! It is an injection that is given under the skin every 2 weeks. The medication often requires a prior authorization from your insurance company. We will take care of submitting all the necessary information to your insurance company to get it approved.  It has also been shown to lower the Lp(a) in about 25-50% of patients.  This is why we are recommending it's use for you.  If we don't see any change in the Lp(a) after 2-3 months on medication, we would most likely stop.   Thank you for choosing [facility]

## 2023-06-04 ENCOUNTER — Encounter: Payer: Self-pay | Admitting: Pharmacist Clinician (PhC)/ Clinical Pharmacy Specialist

## 2023-06-05 ENCOUNTER — Telehealth: Payer: Self-pay | Admitting: Pharmacy Technician

## 2023-06-05 ENCOUNTER — Other Ambulatory Visit (HOSPITAL_COMMUNITY): Payer: Self-pay

## 2023-06-05 DIAGNOSIS — I2510 Atherosclerotic heart disease of native coronary artery without angina pectoris: Secondary | ICD-10-CM

## 2023-06-05 DIAGNOSIS — E785 Hyperlipidemia, unspecified: Secondary | ICD-10-CM

## 2023-06-05 NOTE — Telephone Encounter (Signed)
 Pharmacy Patient Advocate Encounter   Received notification from Physician's Office that prior authorization for Repatha is required/requested.   Insurance verification completed.   The patient is insured through [employer] .   Per test claim: PA required; PA submitted to above mentioned insurance via CoverMyMeds Key/confirmation #/EOC [id number] Status is pending

## 2023-06-05 NOTE — Telephone Encounter (Signed)
 Pharmacy Patient Advocate Encounter  Received notification from [employer] that Prior Authorization for repatha has been APPROVED from 03/11/23 to 03/09/24. Ran test claim, Copay is $144.24- one month. This test claim was processed through [facility]- copay amounts may vary at other pharmacies due to pharmacy/plan contracts, or as the patient moves through the different stages of their insurance plan.   PA #/Case ID/Reference #: [id number]

## 2023-06-09 MED ORDER — REPATHA SURECLICK 140 MG/ML ~~LOC~~ SOAJ: 1.0000 mL | SUBCUTANEOUS | 5 refills | Status: DC

## 2023-06-09 NOTE — Addendum Note (Signed)
 Addended by: Cheree Ditto on: 06/09/2023 09:21 AM   Modules accepted: Orders

## 2023-06-29 ENCOUNTER — Ambulatory Visit: Payer: Medicare HMO | Admitting: Neurosurgery

## 2023-07-06 ENCOUNTER — Ambulatory Visit: Payer: Medicare HMO | Admitting: Neurosurgery

## 2023-07-06 VITALS — BP 144/82 | Ht 65.0 in | Wt 191.0 lb

## 2023-07-06 DIAGNOSIS — R29898 Other symptoms and signs involving the musculoskeletal system: Secondary | ICD-10-CM | POA: Diagnosis not present

## 2023-07-06 DIAGNOSIS — Z09 Encounter for follow-up examination after completed treatment for conditions other than malignant neoplasm: Secondary | ICD-10-CM | POA: Diagnosis not present

## 2023-07-06 NOTE — Progress Notes (Signed)
   REFERRING PHYSICIAN:  No referring provider defined for this encounter.  DOS: 01/06/23 right radial to axillary nerve transfer end to side  HISTORY OF PRESENT ILLNESS:  He was doing well at last visit with minimal pain.  He has felt some minor improvements in his shoulder function.  He is able to touch his contralateral shoulder and touch the top of his head, he still does feel quite restricted however.  He has not had any focused passive range of motion therapy  PHYSICAL EXAMINATION:  NEUROLOGICAL:  General: In no acute distress.   Awake, alert, oriented to person, place, and time.  Pupils equal round and reactive to light.  Facial tone is symmetric.    Strength: Side Biceps Triceps Deltoid Interossei Grip Wrist Ext. Wrist Flex.  R 4+ 5 2, I do feel active contraction of his lateral head and anterior head to a lesser degree 5 5 5 5   L 5 5 5 5 5 5 5    Incision well healed  Imaging:  Nothing new to review.   Assessment / Plan: Joel [name] is doing well s/p above surgery. Treatment options reviewed with patient and following plan made with Dr. Felipe Horton (after his visit):   - Continue with PT. I think he would benefit from passive range of motion therapy for his glenohumeral joint.  He seems to be limited to approximately 30 degrees passively on abduction, approximately 50 degrees on forward flexion - Biceps is at least 10 pounds resistance, would not recommend a nerve transfer for augmentation - Follow-up in 6 months  Carroll Clamp, MD Dept of Neurosurgery

## 2023-07-18 ENCOUNTER — Encounter: Payer: Self-pay | Admitting: Cardiovascular Disease

## 2023-08-14 ENCOUNTER — Ambulatory Visit: Payer: Self-pay

## 2023-08-17 ENCOUNTER — Encounter: Payer: Self-pay | Admitting: Family Medicine

## 2023-08-17 ENCOUNTER — Ambulatory Visit (INDEPENDENT_AMBULATORY_CARE_PROVIDER_SITE_OTHER): Payer: Medicare HMO | Admitting: Family Medicine

## 2023-08-17 VITALS — BP 120/72 | HR 68 | Temp 98.1°F | Ht 65.0 in | Wt 188.8 lb

## 2023-08-17 DIAGNOSIS — I10 Essential (primary) hypertension: Secondary | ICD-10-CM

## 2023-08-17 DIAGNOSIS — Z0000 Encounter for general adult medical examination without abnormal findings: Secondary | ICD-10-CM

## 2023-08-17 DIAGNOSIS — E785 Hyperlipidemia, unspecified: Secondary | ICD-10-CM

## 2023-08-17 MED ORDER — AMLODIPINE BESYLATE 10 MG PO TABS: 10.0000 mg | ORAL_TABLET | Freq: Every day | ORAL | 1 refills | Status: DC

## 2023-08-17 MED ORDER — LISINOPRIL 10 MG PO TABS: 10.0000 mg | ORAL_TABLET | Freq: Every day | ORAL | 3 refills | Status: DC

## 2023-08-17 NOTE — Progress Notes (Signed)
 Complete physical exam  Patient: Joel [name]   DOB: 1946/02/12   78 y.o. Male  MRN: [id number]  Subjective:     Chief Complaint  Patient presents with   Medical Management of Chronic Issues    CRISTINO DEGROFF is a 78 y.o. male who presents today for a complete physical exam. He reports consuming a general diet. Gym/ health club routine includes light weights and walking three times per week. He generally feels well. He reports sleeping well. He does not have additional problems to discuss today.    Most recent fall risk assessment:    08/16/2023   11:19 AM  Fall Risk   Falls in the past year? 0     Most recent depression screenings:    02/17/2023    8:00 AM 08/11/2022   12:55 PM  PHQ 2/9 Scores  PHQ - 2 Score 0 0  PHQ- 9 Score 0     Vision:Within last year and Dental: No current dental problems and Receives regular dental care  Patient Active Problem List   Diagnosis Date Noted   Statin intolerance 02/17/2023   Brachial plexopathy 01/06/2023   Right arm weakness 12/22/2022   Elevated PSA 10/27/2013   Dyslipidemia 05/07/2009   Essential hypertension 05/07/2009   BPH (benign prostatic hyperplasia) 05/07/2009   History of colonic polyps 05/07/2009      Patient Care Team: Aida House, MD as PCP - General (Family Medicine) Trent Frizzle, MD as Consulting Physician (Urology) [place], Amy, MD as Consulting Physician (Dermatology)   Outpatient Medications Prior to Visit  Medication Sig   aspirin  81 MG tablet Take 81 mg by mouth daily.   CINNAMON PO Take by mouth.   Evolocumab  (REPATHA  SURECLICK) 140 MG/ML SOAJ Inject 140 mg into the skin every 14 (fourteen) days.   finasteride (PROSCAR) 5 MG tablet Take 5 mg by mouth daily.   Multiple Vitamin (MULTIVITAMIN) capsule Take 1 capsule by mouth daily.   Omega 3-6-9 Fatty Acids (OMEGA-3-6-9 PO) Take by mouth.   tamsulosin  (FLOMAX ) 0.4 MG CAPS capsule Take 2 capsules by mouth daily.   [DISCONTINUED]  amLODipine  (NORVASC ) 10 MG tablet TAKE 1 TABLET BY MOUTH EVERY DAY   [DISCONTINUED] lisinopril  (ZESTRIL ) 10 MG tablet TAKE 1 TABLET BY MOUTH EVERY DAY   atorvastatin  (LIPITOR) 20 MG tablet Take 1 tablet (20 mg total) by mouth daily.   No facility-administered medications prior to visit.    Review of Systems  HENT:  Negative for hearing loss.   Eyes:  Negative for blurred vision.  Respiratory:  Negative for shortness of breath.   Cardiovascular:  Negative for chest pain.  Gastrointestinal: Negative.   Genitourinary: Negative.   Musculoskeletal:  Negative for back pain.  Neurological:  Negative for headaches.  Psychiatric/Behavioral:  Negative for depression.        Objective:     BP 120/72   Pulse 68   Temp 98.1 F (36.7 C) (Oral)   Ht 5' 5 (1.651 m)   Wt 188 lb 12.8 oz (85.6 kg)   SpO2 98%   BMI 31.42 kg/m    Physical Exam Vitals reviewed.  Constitutional:      Appearance: Normal appearance. He is well-groomed and normal weight.  HENT:     Right Ear: Tympanic membrane and ear canal normal.     Left Ear: Tympanic membrane and ear canal normal.     Mouth/Throat:     Mouth: Mucous membranes are moist.     Pharynx: No  posterior oropharyngeal erythema.   Eyes:     Extraocular Movements: Extraocular movements intact.     Conjunctiva/sclera: Conjunctivae normal.   Neck:     Thyroid : No thyromegaly.   Cardiovascular:     Rate and Rhythm: Normal rate and regular rhythm.     Heart sounds: S1 normal and S2 normal. No murmur heard. Pulmonary:     Effort: Pulmonary effort is normal.     Breath sounds: Normal breath sounds and air entry. No rales.  Abdominal:     General: Abdomen is flat. Bowel sounds are normal.   Musculoskeletal:     Right lower leg: No edema.     Left lower leg: No edema.  Lymphadenopathy:     Cervical: No cervical adenopathy.   Neurological:     General: No focal deficit present.     Mental Status: He is alert and oriented to person, place,  and time.     Gait: Gait is intact.   Psychiatric:        Mood and Affect: Mood and affect normal.      No results found for any visits on 08/17/23.     Assessment & Plan:    Routine Health Maintenance and Physical Exam  Immunization History  Administered Date(s) Administered   Fluad Quad(high Dose 65+) 12/14/2018   Fluad Trivalent(High Dose 65+) 02/17/2023   Influenza Split 01/01/2011   Influenza Whole 01/08/2010   Influenza, High Dose Seasonal PF 01/13/2014, 02/22/2016, 01/11/2018   Influenza,inj,Quad PF,6+ Mos 03/20/2015   Influenza-Unspecified 02/22/2016, 01/28/2017, 12/14/2018, 12/20/2020, 12/13/2021   PFIZER(Purple Top)SARS-COV-2 Vaccination 04/07/2019, 05/05/2019, 12/13/2019, 04/10/2020   PNEUMOCOCCAL CONJUGATE-20 08/27/2021   Pfizer Covid-19 Vaccine Bivalent Booster 48yrs & up 03/18/2021, 08/27/2021, 12/13/2021   Pneumococcal Conjugate-13 10/27/2013   Pneumococcal Polysaccharide-23 09/24/2012   Td 01/08/2010   Tdap 08/08/2020   Zoster Recombinant(Shingrix) 09/22/2018, 12/14/2018   Zoster, Live 01/08/2010    Health Maintenance  Topic Date Due   Medicare Annual Wellness (AWV)  08/23/2022   COVID-19 Vaccine (8 - 2024-25 season) 11/09/2022   INFLUENZA VACCINE  10/09/2023   DTaP/Tdap/Td (3 - Td or Tdap) 08/09/2030   Pneumococcal Vaccine: 50+ Years  Completed   Hepatitis C Screening  Completed   Zoster Vaccines- Shingrix  Completed   HPV VACCINES  Aged Out   Meningococcal B Vaccine  Aged Out   Colonoscopy  Discontinued    Discussed health benefits of physical activity, and encouraged him to engage in regular exercise appropriate for his age and condition.  Routine general medical examination at a health care facility Normal physical exam findings. I counseled the patient on the recommended amount of exercise per CDC recommendation. I reviewed preventative screening, immunizations, and medical history and updated in the chart, and appropriate labs and vaccinations  were ordered. Handouts given on healthy eating and exercise.     Essential hypertension Assessment & Plan: Current hypertension medications:       Sig   amLODipine  (NORVASC ) 10 MG tablet Take 1 tablet (10 mg total) by mouth daily.   lisinopril  (ZESTRIL ) 10 MG tablet Take 1 tablet (10 mg total) by mouth daily.      Chronic, stable, BP is well controlled today in the office. Will refill his lisinopril  today. No side effects reported.   Orders: -     Lisinopril ; Take 1 tablet (10 mg total) by mouth daily.  Dispense: 90 tablet; Refill: 3  Dyslipidemia Assessment & Plan: Pt currently on atorvastatin , needs new lipid panel after starting therapy.  Will order. I counseled the patient on his 10 year ASCVD risk score. Will forward his labs to his cardiologist.   The 10-year ASCVD risk score (Arnett DK, et al., 2019) is: 26.2%   Values used to calculate the score:     Age: 81 years     Clincally relevant sex: Male     Is Non-Hispanic African American: No     Diabetic: No     Tobacco smoker: No     Systolic Blood Pressure: 120 mmHg     Is BP treated: Yes     HDL Cholesterol: 71 mg/dL     Total Cholesterol: 194 mg/dL   Orders: -     Lipid panel; Future -     Lipoprotein A (LPA); Future -     Hepatic function panel; Future  Other orders -     amLODIPine  Besylate; Take 1 tablet (10 mg total) by mouth daily.  Dispense: 90 tablet; Refill: 1    Return in about 6 months (around 02/16/2024) for HTN.     Aida House, MD

## 2023-08-21 NOTE — Assessment & Plan Note (Signed)
 Pt currently on atorvastatin , needs new lipid panel after starting therapy. Will order. I counseled the patient on his 10 year ASCVD risk score. Will forward his labs to his cardiologist.   The 10-year ASCVD risk score (Arnett DK, et al., 2019) is: 26.2%   Values used to calculate the score:     Age: 78 years     Clincally relevant sex: Male     Is Non-Hispanic African American: No     Diabetic: No     Tobacco smoker: No     Systolic Blood Pressure: 120 mmHg     Is BP treated: Yes     HDL Cholesterol: 71 mg/dL     Total Cholesterol: 194 mg/dL

## 2023-08-21 NOTE — Assessment & Plan Note (Signed)
 Current hypertension medications:       Sig   amLODipine  (NORVASC ) 10 MG tablet Take 1 tablet (10 mg total) by mouth daily.   lisinopril  (ZESTRIL ) 10 MG tablet Take 1 tablet (10 mg total) by mouth daily.      Chronic, stable, BP is well controlled today in the office. Will refill his lisinopril  today. No side effects reported.

## 2023-09-18 ENCOUNTER — Other Ambulatory Visit (INDEPENDENT_AMBULATORY_CARE_PROVIDER_SITE_OTHER)

## 2023-09-18 ENCOUNTER — Ambulatory Visit: Payer: Self-pay | Admitting: Family Medicine

## 2023-09-18 DIAGNOSIS — E785 Hyperlipidemia, unspecified: Secondary | ICD-10-CM

## 2023-09-18 LAB — HEPATIC FUNCTION PANEL
ALT: 26 U/L (ref 0–53)
AST: 25 U/L (ref 0–37)
Albumin: 4.6 g/dL (ref 3.5–5.2)
Alkaline Phosphatase: 71 U/L (ref 39–117)
Bilirubin, Direct: 0.1 mg/dL (ref 0.0–0.3)
Total Bilirubin: 0.6 mg/dL (ref 0.2–1.2)
Total Protein: 7.3 g/dL (ref 6.0–8.3)

## 2023-09-18 LAB — LIPID PANEL
Cholesterol: 114 mg/dL (ref 0–200)
HDL: 69.2 mg/dL (ref >39.00)
LDL Cholesterol: 31 mg/dL (ref 0–99)
NonHDL: 44.74
Total CHOL/HDL Ratio: 2
Triglycerides: 68 mg/dL (ref 0.0–149.0)
VLDL: 13.6 mg/dL (ref 0.0–40.0)

## 2023-09-22 ENCOUNTER — Ambulatory Visit: Admitting: Family Medicine

## 2023-09-22 LAB — LIPOPROTEIN A (LPA): Lipoprotein (a): 209 nmol/L — ABNORMAL HIGH (ref <75)

## 2023-09-29 ENCOUNTER — Ambulatory Visit (INDEPENDENT_AMBULATORY_CARE_PROVIDER_SITE_OTHER): Admitting: Family Medicine

## 2023-09-29 ENCOUNTER — Encounter: Payer: Self-pay | Admitting: Family Medicine

## 2023-09-29 VITALS — BP 130/78 | Wt 184.0 lb

## 2023-09-29 DIAGNOSIS — Z0000 Encounter for general adult medical examination without abnormal findings: Secondary | ICD-10-CM | POA: Diagnosis not present

## 2023-09-29 NOTE — Progress Notes (Signed)
 Subjective:   Joel [name] is a 78 y.o. male who presents for Medicare Annual/Subsequent preventive examination.  Visit Complete: Virtual I connected with  Joel [name] on 09/29/23 by a video and audio enabled telemedicine application and verified that I am speaking with the correct person using two identifiers.  Patient Location: Home  Provider Location: Office/Clinic  I discussed the limitations of evaluation and management by telemedicine. The patient expressed understanding and agreed to proceed.  Vital Signs: Because this visit was a virtual/telehealth visit, some criteria may be missing or patient reported. Any vitals not documented were not able to be obtained and vitals that have been documented are patient reported.  Patient Medicare AWV questionnaire was completed by the patient on 09/28/2023; I have confirmed that all information answered by patient is correct and no changes since this date.        Objective:    Today's Vitals   09/29/23 1347  BP: 130/78  Weight: 184 lb (83.5 kg)   Body mass index is 30.62 kg/m.     09/29/2023    2:55 PM 01/06/2023    8:48 AM 12/30/2022    8:16 AM 08/22/2021    3:52 PM 10/26/2019    3:17 PM 03/17/2019    3:35 PM 09/05/2014    6:02 PM  Advanced Directives  Does Patient Have a Medical Advance Directive? Yes Yes Yes Yes Yes Yes No   Type of Advance Directive Living will;Healthcare Power of Attorney Living will;Healthcare Power of [employer] Power of [place];Living will Healthcare Power of [place];Living will Healthcare Power of [place];Living will   Does patient want to make changes to medical advance directive? No - Patient declined   No - Patient declined  No - Patient declined   Copy of Healthcare Power of Attorney in Chart? No - copy requested   No - copy requested No - copy requested No - copy requested   Would patient like information on creating a medical advance directive?       No - patient declined  information      Data saved with a previous flowsheet row definition    Current Medications (verified) Outpatient Encounter Medications as of 09/29/2023  Medication Sig   amLODipine  (NORVASC ) 10 MG tablet Take 1 tablet (10 mg total) by mouth daily.   aspirin  81 MG tablet Take 81 mg by mouth daily.   atorvastatin  (LIPITOR) 20 MG tablet Take 1 tablet (20 mg total) by mouth daily.   CINNAMON PO Take by mouth.   Evolocumab  (REPATHA  SURECLICK) 140 MG/ML SOAJ Inject 140 mg into the skin every 14 (fourteen) days.   finasteride (PROSCAR) 5 MG tablet Take 5 mg by mouth daily.   lisinopril  (ZESTRIL ) 10 MG tablet Take 1 tablet (10 mg total) by mouth daily.   Multiple Vitamin (MULTIVITAMIN) capsule Take 1 capsule by mouth daily.   Omega 3-6-9 Fatty Acids (OMEGA-3-6-9 PO) Take by mouth.   tamsulosin  (FLOMAX ) 0.4 MG CAPS capsule Take 2 capsules by mouth daily.   No facility-administered encounter medications on file as of 09/29/2023.    Allergies (verified) Patient has no known allergies.   History: Past Medical History:  Diagnosis Date   Arthritis    BENIGN PROSTATIC HYPERTROPHY 05/07/2009   Brachial plexopathy    COLONIC POLYPS, HX OF 05/07/2009   Coronary artery disease    HYPERLIPIDEMIA 05/07/2009   HYPERTENSION 05/07/2009   Past Surgical History:  Procedure Laterality Date   COLONOSCOPY W/ POLYPECTOMY  HAND TENDON SURGERY     Left thumb   NASAL SINUS SURGERY     NM RENAL LASIX ([facility] HX)     Family History  Problem Relation Age of Onset   Hyperlipidemia Mother    Hypertension Mother    Stroke Mother    Aortic dissection Mother 42   Hyperlipidemia Father    Hypertension Father    Stroke Father 40       cerebellar herniation/hemorrhage   Social History   Socioeconomic History   Marital status: Married    Spouse name: Recruitment consultant (Spouse)   Number of children: 2   Years of education: Not on file   Highest education level: 12th grade  Occupational History    Occupation: retired  Tobacco Use   Smoking status: Never   Smokeless tobacco: Never  Vaping Use   Vaping status: Never Used  Substance and Sexual Activity   Alcohol use: Yes    Comment: weekly   Drug use: No   Sexual activity: Not on file  Other Topics Concern   Not on file  Social History Narrative   Not on file   Social Drivers of Health   Financial Resource Strain: Low Risk  (09/28/2023)   Overall Financial Resource Strain (CARDIA)    Difficulty of Paying Living Expenses: Not hard at all  Food Insecurity: No Food Insecurity (09/28/2023)   Hunger Vital Sign    Worried About Running Out of Food in the Last Year: Never true    Ran Out of Food in the Last Year: Never true  Transportation Needs: No Transportation Needs (09/28/2023)   PRAPARE - Administrator, Civil Service (Medical): No    Lack of Transportation (Non-Medical): No  Physical Activity: Sufficiently Active (09/28/2023)   Exercise Vital Sign    Days of Exercise per Week: 6 days    Minutes of Exercise per Session: 90 min  Stress: No Stress Concern Present (09/28/2023)   [employer] of Occupational Health - Occupational Stress Questionnaire    Feeling of Stress: Not at all  Social Connections: Moderately Isolated (09/28/2023)   Social Connection and Isolation Panel    Frequency of Communication with Friends and Family: Three times a week    Frequency of Social Gatherings with Friends and Family: More than three times a week    Attends Religious Services: Patient declined    Database administrator or Organizations: No    Attends Engineer, structural: Not on file    Marital Status: Married    Tobacco Counseling Counseling given: Not Answered   Clinical Intake:  Pre-visit preparation completed: Yes  Pain : No/denies pain     BMI - recorded: 30.62 Nutritional Status: BMI > 30  Obese Nutritional Risks: None Diabetes: No  How often do you need to have someone help you when you read  instructions, pamphlets, or other written materials from your doctor or pharmacy?: 1 - Never  Interpreter Needed?: No      Activities of Daily Living    09/28/2023    8:47 PM 01/06/2023    8:53 AM  In your present state of health, do you have any difficulty performing the following activities:  Hearing? 0 0  Vision? 0 0  Difficulty concentrating or making decisions? 0 0  Walking or climbing stairs? 0   Dressing or bathing? 0   Doing errands, shopping? 0   Preparing Food and eating ? N   Using the Toilet? N  In the past six months, have you accidently leaked urine? N   Do you have problems with loss of bowel control? N   Managing your Medications? N   Managing your Finances? N   Housekeeping or managing your Housekeeping? N     Patient Care Team: [name], MD as PCP - General (Family Medicine) Matilda Senior, MD as Consulting Physician (Urology) [place], Amy, MD as Consulting Physician (Dermatology)  Indicate any recent Medical Services you may have received from other than [facility] providers in the past year (date may be approximate).     Assessment:   This is a routine wellness examination for Rami.  Hearing/Vision screen No results found.   Goals Addressed               This Visit's Progress     Patient Stated (pt-stated)   On track     Lose a couple pounds.       Depression Screen    09/29/2023    1:48 PM 02/17/2023    8:00 AM 08/11/2022   12:55 PM 01/09/2022    3:06 PM 08/22/2021    3:49 PM 07/31/2021   11:34 AM 07/30/2020    8:51 AM  PHQ 2/9 Scores  PHQ - 2 Score 0 0 0 0 0 0 0  PHQ- 9 Score 0 0   0 0 0    Fall Risk    09/29/2023    1:47 PM 09/28/2023    8:47 PM 08/16/2023   11:19 AM 02/17/2023    8:00 AM 08/11/2022   12:55 PM  Fall Risk   Falls in the past year? 0 0 0 0 0  Number falls in past yr: 0 0  0 0  Injury with Fall? 0 0  0 0  Risk for fall due to : No Fall Risks   No Fall Risks No Fall Risks  Follow up Falls evaluation  completed   Falls evaluation completed Falls evaluation completed    MEDICARE RISK AT HOME: Medicare Risk at Home Any stairs in or around the home?: (Patient-Rptd) Yes If so, are there any without handrails?: (Patient-Rptd) No Home free of loose throw rugs in walkways, pet beds, electrical cords, etc?: (Patient-Rptd) Yes Adequate lighting in your home to reduce risk of falls?: (Patient-Rptd) Yes Life alert?: (Patient-Rptd) No Use of a cane, walker or w/c?: (Patient-Rptd) No Grab bars in the bathroom?: (Patient-Rptd) No Shower chair or bench in shower?: (Patient-Rptd) No Elevated toilet seat or a handicapped toilet?: (Patient-Rptd) No  TIMED UP AND GO:  Was the test performed?  No    Cognitive Function:        09/29/2023    2:55 PM 08/22/2021    3:52 PM 10/26/2019    3:28 PM  6CIT Screen  What Year? 0 points 0 points 0 points  What month? 0 points 0 points 0 points  What time? 0 points 0 points   Count back from 20 0 points 0 points 0 points  Months in reverse 0 points 0 points 0 points  Repeat phrase 0 points 0 points 0 points  Total Score 0 points 0 points     Immunizations Immunization History  Administered Date(s) Administered   Fluad Quad(high Dose 65+) 12/14/2018   Fluad Trivalent(High Dose 65+) 02/17/2023   Influenza Split 01/01/2011   Influenza Whole 01/08/2010   Influenza, High Dose Seasonal PF 01/13/2014, 02/22/2016, 01/11/2018   Influenza,inj,Quad PF,6+ Mos 03/20/2015   Influenza-Unspecified 02/22/2016, 01/28/2017, 12/14/2018, 12/20/2020, 12/13/2021  PFIZER(Purple Top)SARS-COV-2 Vaccination 04/07/2019, 05/05/2019, 12/13/2019, 04/10/2020   PNEUMOCOCCAL CONJUGATE-20 08/27/2021   Pfizer Covid-19 Vaccine Bivalent Booster 21yrs & up 03/18/2021, 08/27/2021, 12/13/2021   Pneumococcal Conjugate-13 10/27/2013   Pneumococcal Polysaccharide-23 09/24/2012   Td 01/08/2010   Tdap 08/08/2020   Zoster Recombinant(Shingrix) 09/22/2018, 12/14/2018   Zoster, Live  01/08/2010    TDAP status: Up to date  Flu Vaccine status: Up to date  Pneumococcal vaccine status: Up to date  Covid-19 vaccine status: Completed vaccines  Qualifies for Shingles Vaccine? Yes   Zostavax completed Yes   Shingrix Completed?: Yes  Screening Tests Health Maintenance  Topic Date Due   COVID-19 Vaccine (8 - 2024-25 season) 11/09/2022   INFLUENZA VACCINE  10/09/2023   Medicare Annual Wellness (AWV)  09/28/2024   DTaP/Tdap/Td (3 - Td or Tdap) 08/09/2030   Pneumococcal Vaccine: 50+ Years  Completed   Hepatitis C Screening  Completed   Zoster Vaccines- Shingrix  Completed   Hepatitis B Vaccines  Aged Out   HPV VACCINES  Aged Out   Meningococcal B Vaccine  Aged Out   Colonoscopy  Discontinued    Health Maintenance  Health Maintenance Due  Topic Date Due   COVID-19 Vaccine (8 - 2024-25 season) 11/09/2022    Colorectal cancer screening: No longer required.   Lung Cancer Screening: (Low Dose CT Chest recommended if Age 71-80 years, 20 pack-year currently smoking OR have quit w/in 15years.) does not qualify.   Lung Cancer Screening Referral: N/A  Additional Screening:  Hepatitis C Screening: does qualify; Completed 03/31/2017 --negative  Vision Screening: Recommended annual ophthalmology exams for early detection of glaucoma and other disorders of the eye. Is the patient up to date with their annual eye exam?  has an appt in August Who is the provider or what is the name of the office in which the patient attends annual eye exams? Dr. Robinson If pt is not established with a provider, would they like to be referred to a provider to establish care? No .   Dental Screening: Recommended annual dental exams for proper oral hygiene -- sees Dr. Nena Riding regularly    Community Resource Referral / Chronic Care Management: CRR required this visit?  No   CCM required this visit?  No     Plan:     I have personally reviewed and noted the following in the  patient's chart:   Medical and social history Use of alcohol, tobacco or illicit drugs  Current medications and supplements including opioid prescriptions. Patient is not currently taking opioid prescriptions. Functional ability and status Nutritional status Physical activity Advanced directives List of other physicians Hospitalizations, surgeries, and ER visits in previous 12 months Vitals Screenings to include cognitive, depression, and falls Referrals and appointments  In addition, I have reviewed and discussed with patient certain preventive protocols, quality metrics, and best practice recommendations. A written personalized care plan for preventive services as well as general preventive health recommendations were provided to patient.     Heron CHRISTELLA Sharper, MD   09/29/2023   After Visit Summary: (MyChart) Due to this being a telephonic visit, the after visit summary with patients personalized plan was offered to patient via MyChart

## 2023-10-12 DIAGNOSIS — Z85828 Personal history of other malignant neoplasm of skin: Secondary | ICD-10-CM | POA: Diagnosis not present

## 2023-10-12 DIAGNOSIS — D485 Neoplasm of uncertain behavior of skin: Secondary | ICD-10-CM | POA: Diagnosis not present

## 2023-10-12 DIAGNOSIS — D3617 Benign neoplasm of peripheral nerves and autonomic nervous system of trunk, unspecified: Secondary | ICD-10-CM | POA: Diagnosis not present

## 2023-10-12 DIAGNOSIS — L820 Inflamed seborrheic keratosis: Secondary | ICD-10-CM | POA: Diagnosis not present

## 2023-10-12 DIAGNOSIS — L821 Other seborrheic keratosis: Secondary | ICD-10-CM | POA: Diagnosis not present

## 2023-10-12 DIAGNOSIS — L570 Actinic keratosis: Secondary | ICD-10-CM | POA: Diagnosis not present

## 2023-10-12 DIAGNOSIS — D2271 Melanocytic nevi of right lower limb, including hip: Secondary | ICD-10-CM | POA: Diagnosis not present

## 2023-10-12 DIAGNOSIS — L738 Other specified follicular disorders: Secondary | ICD-10-CM | POA: Diagnosis not present

## 2023-11-04 DIAGNOSIS — H2513 Age-related nuclear cataract, bilateral: Secondary | ICD-10-CM | POA: Diagnosis not present

## 2023-11-04 DIAGNOSIS — H5203 Hypermetropia, bilateral: Secondary | ICD-10-CM | POA: Diagnosis not present

## 2023-11-04 DIAGNOSIS — H43813 Vitreous degeneration, bilateral: Secondary | ICD-10-CM | POA: Diagnosis not present

## 2023-11-04 DIAGNOSIS — D3131 Benign neoplasm of right choroid: Secondary | ICD-10-CM | POA: Diagnosis not present

## 2023-11-19 ENCOUNTER — Other Ambulatory Visit: Payer: Self-pay | Admitting: Cardiovascular Disease

## 2023-11-19 DIAGNOSIS — E785 Hyperlipidemia, unspecified: Secondary | ICD-10-CM

## 2023-11-19 DIAGNOSIS — I2510 Atherosclerotic heart disease of native coronary artery without angina pectoris: Secondary | ICD-10-CM

## 2024-01-01 NOTE — Progress Notes (Signed)
   REFERRING PHYSICIAN:  Ozell [name] HERO, Md [address] [place],  [place] [address]  DOS: 01/06/23 right radial to axillary nerve transfer end to side  HISTORY OF PRESENT ILLNESS:  He was doing well at last visit with minimal pain.  From a motor recovery standpoint, he has been working with physical therapy.  From immediately preop he did have some improvements but has not felt a significant amount of improvement since then.   PHYSICAL EXAMINATION:  NEUROLOGICAL:  General: In no acute distress.   Awake, alert, oriented to person, place, and time.  Pupils equal round and reactive to light.  Facial tone is symmetric.    Strength: Side Biceps Triceps Deltoid Interossei Grip Wrist Ext. Wrist Flex.  R 4+ 5 I am able to feel active contraction and the anterior and lateral heads of the deltoid, he does however appear to have a hard stop while attempting to abduct the shoulder. 5 5 5 5   L 5 5 5 5 5 5 5    Incision well healed  Imaging:  Nothing new to review.   Assessment / Plan: Joel [name] did not have any worsening deficits after surgery.  He had a very minor improvement immediately after surgery however he has had a significant plateau.  On physical examination however he does appear to have palpable contractions of both his lateral and anterior heads of his deltoid.  He does appear to have quite a hard stop and shoulder mobility, this is present as well on passive range of motion.  I would like to have him see his shoulder surgeon again to see whether or not there is any development of adhesive capsulitis or contractures formation.  If there is not then we would plan on moving forward with an EMG and nerve conduction study.  It still not outside of the timing for having some recovery of motor function from the nerve transfer, however we did discuss that some patients do not get significant improvement   Joel MICAEL Sharps, MD Dept of Neurosurgery

## 2024-01-06 ENCOUNTER — Ambulatory Visit: Admitting: Neurosurgery

## 2024-01-06 VITALS — BP 122/70 | Ht 65.0 in | Wt 191.5 lb

## 2024-01-06 DIAGNOSIS — R29898 Other symptoms and signs involving the musculoskeletal system: Secondary | ICD-10-CM | POA: Diagnosis not present

## 2024-01-12 DIAGNOSIS — Z85828 Personal history of other malignant neoplasm of skin: Secondary | ICD-10-CM | POA: Diagnosis not present

## 2024-01-12 DIAGNOSIS — L820 Inflamed seborrheic keratosis: Secondary | ICD-10-CM | POA: Diagnosis not present

## 2024-01-12 DIAGNOSIS — L570 Actinic keratosis: Secondary | ICD-10-CM | POA: Diagnosis not present

## 2024-01-12 DIAGNOSIS — L738 Other specified follicular disorders: Secondary | ICD-10-CM | POA: Diagnosis not present

## 2024-01-12 DIAGNOSIS — L821 Other seborrheic keratosis: Secondary | ICD-10-CM | POA: Diagnosis not present

## 2024-02-16 ENCOUNTER — Ambulatory Visit: Admitting: Family Medicine

## 2024-02-16 ENCOUNTER — Encounter: Payer: Self-pay | Admitting: Family Medicine

## 2024-02-16 VITALS — BP 138/70 | HR 71 | Temp 97.5°F | Ht 65.0 in | Wt 191.4 lb

## 2024-02-16 DIAGNOSIS — I10 Essential (primary) hypertension: Secondary | ICD-10-CM | POA: Diagnosis not present

## 2024-02-16 DIAGNOSIS — Z131 Encounter for screening for diabetes mellitus: Secondary | ICD-10-CM

## 2024-02-16 DIAGNOSIS — E785 Hyperlipidemia, unspecified: Secondary | ICD-10-CM

## 2024-02-16 LAB — LIPID PANEL
Cholesterol: 133 mg/dL (ref 0–200)
HDL: 85.1 mg/dL (ref >39.00)
LDL Cholesterol: 36 mg/dL (ref 0–99)
NonHDL: 47.69
Total CHOL/HDL Ratio: 2
Triglycerides: 56 mg/dL (ref 0.0–149.0)
VLDL: 11.2 mg/dL (ref 0.0–40.0)

## 2024-02-16 LAB — COMPREHENSIVE METABOLIC PANEL WITH GFR
ALT: 33 U/L (ref 0–53)
AST: 29 U/L (ref 0–37)
Albumin: 4.7 g/dL (ref 3.5–5.2)
Alkaline Phosphatase: 75 U/L (ref 39–117)
BUN: 17 mg/dL (ref 6–23)
CO2: 28 meq/L (ref 19–32)
Calcium: 9.5 mg/dL (ref 8.4–10.5)
Chloride: 102 meq/L (ref 96–112)
Creatinine, Ser: 1.06 mg/dL (ref 0.40–1.50)
GFR: 67.64 mL/min (ref >60.00)
Glucose, Bld: 95 mg/dL (ref 70–99)
Potassium: 4.2 meq/L (ref 3.5–5.1)
Sodium: 138 meq/L (ref 135–145)
Total Bilirubin: 0.6 mg/dL (ref 0.2–1.2)
Total Protein: 7.3 g/dL (ref 6.0–8.3)

## 2024-02-16 LAB — HEMOGLOBIN A1C: Hgb A1c MFr Bld: 5.7 % (ref 4.6–6.5)

## 2024-02-16 MED ORDER — LISINOPRIL 10 MG PO TABS: 10.0000 mg | ORAL_TABLET | Freq: Every day | ORAL | 3 refills | Status: AC

## 2024-02-16 MED ORDER — AMLODIPINE BESYLATE 10 MG PO TABS: 10.0000 mg | ORAL_TABLET | Freq: Every day | ORAL | 1 refills | Status: AC

## 2024-02-16 NOTE — Progress Notes (Signed)
 Established Patient Office Visit  Subjective   Patient ID: Joel [name], male    DOB: 14-Sep-1945  Age: 78 y.o. MRN: [id number]  Chief Complaint  Patient presents with   Medical Management of Chronic Issues    HPI Discussed the use of AI scribe software for clinical note transcription with the patient, who gave verbal consent to proceed.  History of Present Illness   Joel [name] is a 78 year old male with hyperlipidemia and coronary artery disease who presents for a six-month follow-up.  His cholesterol has improved significantly since starting Repatha  in addition to atorvastatin . He has had no medication side effects. His apolipoprotein A was over 200 and his coronary calcium  score was over 1600, and he is continuing both lipid-lowering medications.  His home blood pressure has been in the upper 120s to low 130s, which he finds acceptable. He takes amlodipine  without issues.  He developed a backache that worsened about a week ago to the point it was hard to walk. It has improved with rest and occasional Tylenol . He thinks he may have twisted his back while doing yard work. The pain does not radiate and feels like a brief "zing" with certain movements. He had considered whether this could be a Repatha  side effect.      Current Outpatient Medications  Medication Instructions   amLODipine  (NORVASC ) 10 mg, Oral, Daily   aspirin  81 mg, Daily   atorvastatin  (LIPITOR) 20 mg, Oral, Daily   CINNAMON PO Take by mouth.   finasteride (PROSCAR) 5 mg, Daily   lisinopril  (ZESTRIL ) 10 mg, Oral, Daily   Multiple Vitamin (MULTIVITAMIN) capsule 1 capsule, Daily   Omega 3-6-9 Fatty Acids (OMEGA-3-6-9 PO) Take by mouth.   Repatha  SureClick 140 mg, Subcutaneous, Every 14 days   tamsulosin  (FLOMAX ) 0.4 MG CAPS capsule 2 capsules, Daily    Patient Active Problem List   Diagnosis Date Noted   Statin intolerance 02/17/2023   Brachial plexopathy 01/06/2023   Right arm weakness 12/22/2022    Elevated PSA 10/27/2013   Dyslipidemia 05/07/2009   Essential hypertension 05/07/2009   BPH (benign prostatic hyperplasia) 05/07/2009   History of colonic polyps 05/07/2009     Review of Systems  All other systems reviewed and are negative.     Objective:     BP 138/70   Pulse 71   Temp (!) 97.5 F (36.4 C) (Oral)   Ht 5' 5 (1.651 m)   Wt 191 lb 6.4 oz (86.8 kg)   SpO2 98%   BMI 31.85 kg/m    Physical Exam Vitals reviewed.  Constitutional:      Appearance: Normal appearance. He is obese.  Eyes:     Conjunctiva/sclera: Conjunctivae normal.  Cardiovascular:     Rate and Rhythm: Normal rate and regular rhythm.     Heart sounds: Normal heart sounds. No murmur heard. Pulmonary:     Effort: Pulmonary effort is normal.     Breath sounds: Normal breath sounds.  Abdominal:     General: Bowel sounds are normal.  Neurological:     Mental Status: He is alert and oriented to person, place, and time. Mental status is at baseline.      No results found for any visits on 02/16/24.    The ASCVD Risk score (Arnett DK, et al., 2019) failed to calculate for the following reasons:   The valid total cholesterol range is 130 to 320 mg/dL    Assessment & Plan:  Dyslipidemia -  Lipid panel; Future  Diabetes mellitus screening -     Hemoglobin A1c; Future  Essential hypertension -     amLODIPine  Besylate; Take 1 tablet (10 mg total) by mouth daily.  Dispense: 90 tablet; Refill: 1 -     Lisinopril ; Take 1 tablet (10 mg total) by mouth daily.  Dispense: 90 tablet; Refill: 3 -     Comprehensive metabolic panel with GFR; Future   Assessment and Plan    Dyslipidemia with high cardiovascular risk Dyslipidemia with high cardiovascular risk, managed with atorvastatin  and Repatha . Previous lipid panel showed significant improvement. High calcium  score and elevated apolipoprotein A indicate significant cardiovascular risk. Combination therapy provides additive LDL reduction in  approximately 60% beyond statin alone. No side effects reported from Repatha , though backache was noted as a potential side effect. - Rechecked lipid panel today - Continue atorvastatin  and Repatha  - Forwarded results to cardiologist for further evaluation  Essential hypertension Well-controlled with current medication regimen. Home blood pressure readings are in the upper 120s/low 130s, which is satisfactory. - Continue current antihypertensive medication regimen - Refilled blood pressure medications  Low back pain Intermittent low back pain, possibly related to recent yard work. Pain was severe enough to impair walking but has resolved with rest. Possible nerve involvement due to zingy pain sensation. - Monitor symptoms and use Tylenol  or ibuprofen as needed - Will consider back x-rays if symptoms recur or worsen  General health maintenance Up to date on all health maintenance, including flu shot, pneumonia vaccine, and shingles vaccine. Due for diabetes screening as it has not been done since last year. - Ordered diabetes screening blood test        Return in about 6 months (around 08/16/2024) for annual physical exam.    Heron CHRISTELLA Sharper, MD

## 2024-02-17 ENCOUNTER — Ambulatory Visit: Payer: Self-pay | Admitting: Family Medicine

## 2024-02-29 DIAGNOSIS — M25511 Pain in right shoulder: Secondary | ICD-10-CM | POA: Diagnosis not present

## 2024-03-17 ENCOUNTER — Encounter: Payer: Self-pay | Admitting: Cardiovascular Disease

## 2024-03-18 ENCOUNTER — Encounter: Payer: Self-pay | Admitting: Neurosurgery

## 2024-03-18 ENCOUNTER — Telehealth: Payer: Self-pay | Admitting: Pharmacy Technician

## 2024-03-18 NOTE — Telephone Encounter (Signed)
 Pharmacy Patient Advocate Encounter   Received notification from CoverMyMeds that prior authorization for repatha  is required/requested.   Insurance verification completed.   The patient is insured through [employer].   Per test claim: PA required; PA submitted to above mentioned insurance via Latent Key/confirmation #/EOC [id number] Status is pending

## 2024-03-18 NOTE — Telephone Encounter (Signed)
 Pharmacy Patient Advocate Encounter  Received notification from [employer] that Prior Authorization for REPATHA  has been APPROVED from 03/10/24 to 03/09/25   PA #/Case ID/Reference #: [id number]

## 2024-03-29 ENCOUNTER — Other Ambulatory Visit: Payer: Self-pay | Admitting: Neurosurgery

## 2024-03-29 DIAGNOSIS — M7500 Adhesive capsulitis of unspecified shoulder: Secondary | ICD-10-CM

## 2024-04-13 ENCOUNTER — Other Ambulatory Visit: Payer: Self-pay | Admitting: Cardiovascular Disease
